# Patient Record
Sex: Male | Born: 1970 | Race: Black or African American | Hispanic: No | Marital: Single | Smoking: Current every day smoker
Health system: Southern US, Community
[De-identification: ages and names within clinical notes are randomized; demographics above are authoritative.]

## PROBLEM LIST (undated history)

## (undated) DIAGNOSIS — W3400XA Accidental discharge from unspecified firearms or gun, initial encounter: Secondary | ICD-10-CM

## (undated) SURGERY — EXPLORATION, CHEST
Anesthesia: General

---

## 1998-01-07 ENCOUNTER — Emergency Department (HOSPITAL_COMMUNITY): Admission: EM | Admit: 1998-01-07 | Discharge: 1998-01-07 | Payer: Self-pay | Admitting: Emergency Medicine

## 1998-07-27 ENCOUNTER — Emergency Department (HOSPITAL_COMMUNITY): Admission: EM | Admit: 1998-07-27 | Discharge: 1998-07-27 | Payer: Self-pay | Admitting: Emergency Medicine

## 1998-09-02 ENCOUNTER — Emergency Department (HOSPITAL_COMMUNITY): Admission: EM | Admit: 1998-09-02 | Discharge: 1998-09-02 | Payer: Self-pay | Admitting: Emergency Medicine

## 1998-12-11 ENCOUNTER — Emergency Department (HOSPITAL_COMMUNITY): Admission: EM | Admit: 1998-12-11 | Discharge: 1998-12-11 | Payer: Self-pay | Admitting: Emergency Medicine

## 1999-04-07 ENCOUNTER — Emergency Department (HOSPITAL_COMMUNITY): Admission: EM | Admit: 1999-04-07 | Discharge: 1999-04-07 | Payer: Self-pay | Admitting: Emergency Medicine

## 2000-12-26 ENCOUNTER — Encounter: Payer: Self-pay | Admitting: Emergency Medicine

## 2000-12-26 ENCOUNTER — Emergency Department (HOSPITAL_COMMUNITY): Admission: EM | Admit: 2000-12-26 | Discharge: 2000-12-26 | Payer: Self-pay

## 2001-12-16 ENCOUNTER — Emergency Department (HOSPITAL_COMMUNITY): Admission: EM | Admit: 2001-12-16 | Discharge: 2001-12-16 | Payer: Self-pay | Admitting: Emergency Medicine

## 2003-02-10 ENCOUNTER — Emergency Department (HOSPITAL_COMMUNITY): Admission: AD | Admit: 2003-02-10 | Discharge: 2003-02-10 | Payer: Self-pay | Admitting: Family Medicine

## 2003-03-11 ENCOUNTER — Emergency Department (HOSPITAL_COMMUNITY): Admission: EM | Admit: 2003-03-11 | Discharge: 2003-03-11 | Payer: Self-pay | Admitting: Emergency Medicine

## 2003-03-12 ENCOUNTER — Emergency Department (HOSPITAL_COMMUNITY): Admission: EM | Admit: 2003-03-12 | Discharge: 2003-03-12 | Payer: Self-pay | Admitting: Emergency Medicine

## 2003-12-29 ENCOUNTER — Emergency Department (HOSPITAL_COMMUNITY): Admission: EM | Admit: 2003-12-29 | Discharge: 2003-12-29 | Payer: Self-pay | Admitting: Emergency Medicine

## 2010-04-28 ENCOUNTER — Emergency Department (HOSPITAL_COMMUNITY)
Admission: EM | Admit: 2010-04-28 | Discharge: 2010-04-29 | Disposition: A | Discharge status: Home / Self Care | Payer: Self-pay | Attending: Emergency Medicine | Admitting: Emergency Medicine

## 2010-04-28 DIAGNOSIS — R21 Rash and other nonspecific skin eruption: Secondary | ICD-10-CM | POA: Insufficient documentation

## 2010-04-28 DIAGNOSIS — IMO0002 Reserved for concepts with insufficient information to code with codable children: Secondary | ICD-10-CM | POA: Insufficient documentation

## 2010-04-28 DIAGNOSIS — IMO0001 Reserved for inherently not codable concepts without codable children: Secondary | ICD-10-CM | POA: Insufficient documentation

## 2010-04-28 DIAGNOSIS — L2989 Other pruritus: Secondary | ICD-10-CM | POA: Insufficient documentation

## 2010-04-28 DIAGNOSIS — L298 Other pruritus: Secondary | ICD-10-CM | POA: Insufficient documentation

## 2010-08-30 ENCOUNTER — Emergency Department (HOSPITAL_COMMUNITY)
Admission: EM | Admit: 2010-08-30 | Discharge: 2010-08-30 | Disposition: A | Discharge status: Home / Self Care | Payer: Self-pay | Attending: Emergency Medicine | Admitting: Emergency Medicine

## 2010-08-30 DIAGNOSIS — R319 Hematuria, unspecified: Secondary | ICD-10-CM | POA: Insufficient documentation

## 2010-08-30 LAB — URINALYSIS, ROUTINE W REFLEX MICROSCOPIC
Hgb urine dipstick: NEGATIVE
Ketones, ur: 15 mg/dL — AB
Nitrite: NEGATIVE
Specific Gravity, Urine: 1.031 — ABNORMAL HIGH (ref 1.005–1.030)
pH: 6 (ref 5.0–8.0)

## 2010-08-31 LAB — GC/CHLAMYDIA PROBE AMP, URINE: Chlamydia, Swab/Urine, PCR: NEGATIVE

## 2013-08-13 ENCOUNTER — Emergency Department (HOSPITAL_COMMUNITY): Payer: No Typology Code available for payment source

## 2013-08-13 ENCOUNTER — Inpatient Hospital Stay (HOSPITAL_COMMUNITY): Payer: No Typology Code available for payment source

## 2013-08-13 ENCOUNTER — Encounter (HOSPITAL_COMMUNITY): Payer: No Typology Code available for payment source | Admitting: Anesthesiology

## 2013-08-13 ENCOUNTER — Emergency Department (HOSPITAL_COMMUNITY): Payer: No Typology Code available for payment source | Admitting: Anesthesiology

## 2013-08-13 ENCOUNTER — Encounter (HOSPITAL_COMMUNITY): Payer: Self-pay | Admitting: Emergency Medicine

## 2013-08-13 ENCOUNTER — Other Ambulatory Visit: Payer: Self-pay

## 2013-08-13 ENCOUNTER — Inpatient Hospital Stay (HOSPITAL_COMMUNITY)
Admission: EM | Admit: 2013-08-13 | Discharge: 2013-08-18 | DRG: 237 | Disposition: A | Discharge status: Home / Self Care | Payer: No Typology Code available for payment source | Attending: General Surgery | Admitting: General Surgery

## 2013-08-13 ENCOUNTER — Encounter (HOSPITAL_COMMUNITY): Admission: EM | Disposition: A | Discharge status: Home / Self Care | Payer: Self-pay | Source: Home / Self Care

## 2013-08-13 DIAGNOSIS — R4182 Altered mental status, unspecified: Secondary | ICD-10-CM | POA: Diagnosis present

## 2013-08-13 DIAGNOSIS — S2190XA Unspecified open wound of unspecified part of thorax, initial encounter: Principal | ICD-10-CM | POA: Diagnosis present

## 2013-08-13 DIAGNOSIS — J9600 Acute respiratory failure, unspecified whether with hypoxia or hypercapnia: Secondary | ICD-10-CM | POA: Diagnosis present

## 2013-08-13 DIAGNOSIS — D62 Acute posthemorrhagic anemia: Secondary | ICD-10-CM | POA: Diagnosis not present

## 2013-08-13 DIAGNOSIS — R579 Shock, unspecified: Secondary | ICD-10-CM | POA: Diagnosis present

## 2013-08-13 DIAGNOSIS — R791 Abnormal coagulation profile: Secondary | ICD-10-CM | POA: Diagnosis present

## 2013-08-13 DIAGNOSIS — S2690XA Unspecified injury of heart, unspecified with or without hemopericardium, initial encounter: Secondary | ICD-10-CM

## 2013-08-13 DIAGNOSIS — E872 Acidosis, unspecified: Secondary | ICD-10-CM | POA: Diagnosis present

## 2013-08-13 DIAGNOSIS — S21139A Puncture wound without foreign body of unspecified front wall of thorax without penetration into thoracic cavity, initial encounter: Secondary | ICD-10-CM

## 2013-08-13 DIAGNOSIS — W3400XA Accidental discharge from unspecified firearms or gun, initial encounter: Secondary | ICD-10-CM | POA: Diagnosis not present

## 2013-08-13 DIAGNOSIS — I314 Cardiac tamponade: Secondary | ICD-10-CM | POA: Diagnosis present

## 2013-08-13 DIAGNOSIS — T794XXA Traumatic shock, initial encounter: Secondary | ICD-10-CM

## 2013-08-13 DIAGNOSIS — S26022A Major laceration of heart with hemopericardium, initial encounter: Principal | ICD-10-CM

## 2013-08-13 HISTORY — PX: INTRAOPERATIVE TRANSESOPHAGEAL ECHOCARDIOGRAM: SHX5062

## 2013-08-13 HISTORY — PX: MEDIASTERNOTOMY: SHX5084

## 2013-08-13 LAB — POCT I-STAT 4, (NA,K, GLUC, HGB,HCT)
Glucose, Bld: 185 mg/dL — ABNORMAL HIGH (ref 70–99)
Glucose, Bld: 250 mg/dL — ABNORMAL HIGH (ref 70–99)
Glucose, Bld: 157 mg/dL — ABNORMAL HIGH (ref 70–99)
Glucose, Bld: 195 mg/dL — ABNORMAL HIGH (ref 70–99)
Glucose, Bld: 35 mg/dL — CL (ref 70–99)
HCT: 39 % (ref 39.0–52.0)
HCT: 40 % (ref 39.0–52.0)
HCT: 26 % — ABNORMAL LOW (ref 39.0–52.0)
HCT: 29 % — ABNORMAL LOW (ref 39.0–52.0)
HCT: 43 % (ref 39.0–52.0)
HEMOGLOBIN: 14.6 g/dL (ref 13.0–17.0)
Hemoglobin: 13.3 g/dL (ref 13.0–17.0)
Hemoglobin: 13.6 g/dL (ref 13.0–17.0)
Hemoglobin: 8.8 g/dL — ABNORMAL LOW (ref 13.0–17.0)
Hemoglobin: 9.9 g/dL — ABNORMAL LOW (ref 13.0–17.0)
POTASSIUM: 3.1 meq/L — AB (ref 3.7–5.3)
Potassium: 3.4 mEq/L — ABNORMAL LOW (ref 3.7–5.3)
Potassium: 5.5 mEq/L — ABNORMAL HIGH (ref 3.7–5.3)
Potassium: 3.3 mEq/L — ABNORMAL LOW (ref 3.7–5.3)
Potassium: 3.8 mEq/L (ref 3.7–5.3)
Sodium: 139 mEq/L (ref 137–147)
Sodium: 141 mEq/L (ref 137–147)
Sodium: 142 mEq/L (ref 137–147)
Sodium: 143 mEq/L (ref 137–147)
Sodium: 146 mEq/L (ref 137–147)

## 2013-08-13 LAB — I-STAT ARTERIAL BLOOD GAS, ED
Acid-base deficit: 21 mmol/L — ABNORMAL HIGH (ref 0.0–2.0)
Acid-base deficit: 9 mmol/L — ABNORMAL HIGH (ref 0.0–2.0)
Acid-base deficit: 3 mmol/L — ABNORMAL HIGH (ref 0.0–2.0)
Acid-base deficit: 4 mmol/L — ABNORMAL HIGH (ref 0.0–2.0)
Bicarbonate: 17.8 mEq/L — ABNORMAL LOW (ref 20.0–24.0)
Bicarbonate: 5.4 mEq/L — ABNORMAL LOW (ref 20.0–24.0)
Bicarbonate: 22 mEq/L (ref 20.0–24.0)
Bicarbonate: 23 mEq/L (ref 20.0–24.0)
O2 Saturation: 100 %
O2 Saturation: 100 %
O2 Saturation: 100 %
O2 Saturation: 91 %
TCO2: 19 mmol/L (ref 0–100)
TCO2: 6 mmol/L (ref 0–100)
TCO2: 23 mmol/L (ref 0–100)
TCO2: 24 mmol/L (ref 0–100)
pCO2 arterial: 14.9 mmHg — CL (ref 35.0–45.0)
pCO2 arterial: 42.4 mmHg (ref 35.0–45.0)
pCO2 arterial: 41.1 mmHg (ref 35.0–45.0)
pCO2 arterial: 45.5 mmHg — ABNORMAL HIGH (ref 35.0–45.0)
pH, Arterial: 7.169 — CL (ref 7.350–7.450)
pH, Arterial: 7.231 — ABNORMAL LOW (ref 7.350–7.450)
pH, Arterial: 7.312 — ABNORMAL LOW (ref 7.350–7.450)
pH, Arterial: 7.338 — ABNORMAL LOW (ref 7.350–7.450)
pO2, Arterial: 246 mmHg — ABNORMAL HIGH (ref 80.0–100.0)
pO2, Arterial: 294 mmHg — ABNORMAL HIGH (ref 80.0–100.0)
pO2, Arterial: 362 mmHg — ABNORMAL HIGH (ref 80.0–100.0)
pO2, Arterial: 67 mmHg — ABNORMAL LOW (ref 80.0–100.0)

## 2013-08-13 LAB — CBC
HCT: 38.5 % — ABNORMAL LOW (ref 39.0–52.0)
HCT: 34.9 % — ABNORMAL LOW (ref 39.0–52.0)
Hemoglobin: 13.5 g/dL (ref 13.0–17.0)
Hemoglobin: 12.1 g/dL — ABNORMAL LOW (ref 13.0–17.0)
MCH: 30.1 pg (ref 26.0–34.0)
MCH: 29.7 pg (ref 26.0–34.0)
MCHC: 35.1 g/dL (ref 30.0–36.0)
MCHC: 34.7 g/dL (ref 30.0–36.0)
MCV: 85.9 fL (ref 78.0–100.0)
MCV: 85.7 fL (ref 78.0–100.0)
Platelets: 120 10*3/uL — ABNORMAL LOW (ref 150–400)
Platelets: 150 10*3/uL (ref 150–400)
RBC: 4.48 MIL/uL (ref 4.22–5.81)
RBC: 4.07 MIL/uL — ABNORMAL LOW (ref 4.22–5.81)
RDW: 13.4 % (ref 11.5–15.5)
RDW: 13.6 % (ref 11.5–15.5)
WBC: 10.8 10*3/uL — ABNORMAL HIGH (ref 4.0–10.5)
WBC: 6.9 10*3/uL (ref 4.0–10.5)

## 2013-08-13 LAB — POCT I-STAT 3, ART BLOOD GAS (G3+)
BICARBONATE: 24 meq/L (ref 20.0–24.0)
O2 Saturation: 96 %
PH ART: 7.44 (ref 7.350–7.450)
TCO2: 25 mmol/L (ref 0–100)
pCO2 arterial: 34.9 mmHg — ABNORMAL LOW (ref 35.0–45.0)
pO2, Arterial: 72 mmHg — ABNORMAL LOW (ref 80.0–100.0)

## 2013-08-13 LAB — POCT I-STAT, CHEM 8
BUN: 9 mg/dL (ref 6–23)
Calcium, Ion: 1.05 mmol/L — ABNORMAL LOW (ref 1.12–1.23)
Chloride: 108 mEq/L (ref 96–112)
Creatinine, Ser: 0.9 mg/dL (ref 0.50–1.35)
Glucose, Bld: 105 mg/dL — ABNORMAL HIGH (ref 70–99)
HCT: 32 % — ABNORMAL LOW (ref 39.0–52.0)
HEMOGLOBIN: 10.9 g/dL — AB (ref 13.0–17.0)
Potassium: 3.4 mEq/L — ABNORMAL LOW (ref 3.7–5.3)
SODIUM: 143 meq/L (ref 137–147)
TCO2: 23 mmol/L (ref 0–100)

## 2013-08-13 LAB — HEMOGLOBIN AND HEMATOCRIT, BLOOD
HCT: 23.2 % — ABNORMAL LOW (ref 39.0–52.0)
Hemoglobin: 8.1 g/dL — ABNORMAL LOW (ref 13.0–17.0)

## 2013-08-13 LAB — GLUCOSE, CAPILLARY
GLUCOSE-CAPILLARY: 119 mg/dL — AB (ref 70–99)
Glucose-Capillary: 73 mg/dL (ref 70–99)
Glucose-Capillary: 73 mg/dL (ref 70–99)
Glucose-Capillary: 78 mg/dL (ref 70–99)
Glucose-Capillary: 81 mg/dL (ref 70–99)
Glucose-Capillary: 107 mg/dL — ABNORMAL HIGH (ref 70–99)
Glucose-Capillary: 106 mg/dL — ABNORMAL HIGH (ref 70–99)
Glucose-Capillary: 138 mg/dL — ABNORMAL HIGH (ref 70–99)

## 2013-08-13 LAB — I-STAT CHEM 8, ED
BUN: 14 mg/dL (ref 6–23)
CALCIUM ION: 1.2 mmol/L (ref 1.12–1.23)
CHLORIDE: 103 meq/L (ref 96–112)
Creatinine, Ser: 1.5 mg/dL — ABNORMAL HIGH (ref 0.50–1.35)
Glucose, Bld: 211 mg/dL — ABNORMAL HIGH (ref 70–99)
HEMATOCRIT: 45 % (ref 39.0–52.0)
Hemoglobin: 15.3 g/dL (ref 13.0–17.0)
Potassium: 3.3 mEq/L — ABNORMAL LOW (ref 3.7–5.3)
SODIUM: 141 meq/L (ref 137–147)
TCO2: 19 mmol/L (ref 0–100)

## 2013-08-13 LAB — ABO/RH: ABO/RH(D): B POS

## 2013-08-13 LAB — MAGNESIUM: Magnesium: 2.9 mg/dL — ABNORMAL HIGH (ref 1.5–2.5)

## 2013-08-13 LAB — CREATININE, SERUM
Creatinine, Ser: 1.06 mg/dL (ref 0.50–1.35)
GFR calc Af Amer: >90 mL/min (ref >90)
GFR calc non Af Amer: 84 mL/min — ABNORMAL LOW (ref >90)

## 2013-08-13 LAB — PREPARE RBC (CROSSMATCH)

## 2013-08-13 LAB — PROTIME-INR
INR: 1.34 (ref 0.00–1.49)
Prothrombin Time: 16.3 seconds — ABNORMAL HIGH (ref 11.6–15.2)

## 2013-08-13 LAB — PLATELET COUNT: Platelets: 97 10*3/uL — ABNORMAL LOW (ref 150–400)

## 2013-08-13 LAB — APTT: APTT: 31 s (ref 24–37)

## 2013-08-13 SURGERY — MEDIAN STERNOTOMY
Anesthesia: General | Site: Chest

## 2013-08-13 MED ORDER — PROTAMINE SULFATE 10 MG/ML IV SOLN
INTRAVENOUS | Status: DC | PRN
  Administered 2013-08-13: 40 mg via INTRAVENOUS
  Administered 2013-08-13 (×4): 50 mg via INTRAVENOUS

## 2013-08-13 MED ORDER — ASPIRIN EC 325 MG PO TBEC
325.0000 mg | DELAYED_RELEASE_TABLET | Freq: Every day | ORAL | Status: DC
  Administered 2013-08-14 – 2013-08-18 (×5): 325 mg via ORAL
  Filled 2013-08-13 (×5): qty 1

## 2013-08-13 MED ORDER — KETAMINE HCL 100 MG/ML IJ SOLN
INTRAMUSCULAR | Status: AC
  Filled 2013-08-13: qty 1

## 2013-08-13 MED ORDER — NITROGLYCERIN IN D5W 200-5 MCG/ML-% IV SOLN
2.0000 ug/min | INTRAVENOUS | Status: DC
Start: 2013-08-13 — End: 2013-08-13

## 2013-08-13 MED ORDER — VANCOMYCIN HCL IN DEXTROSE 1-5 GM/200ML-% IV SOLN
1000.0000 mg | Freq: Once | INTRAVENOUS | Status: AC
  Administered 2013-08-13: 1000 mg via INTRAVENOUS
  Filled 2013-08-13: qty 200

## 2013-08-13 MED ORDER — METOPROLOL TARTRATE 25 MG/10 ML ORAL SUSPENSION
12.5000 mg | Freq: Two times a day (BID) | ORAL | Status: DC
  Filled 2013-08-13 (×11): qty 5

## 2013-08-13 MED ORDER — 0.9 % SODIUM CHLORIDE (POUR BTL) OPTIME
TOPICAL | Status: DC | PRN
  Administered 2013-08-13: 5000 mL

## 2013-08-13 MED ORDER — INSULIN REGULAR HUMAN 100 UNIT/ML IJ SOLN
INTRAMUSCULAR | Status: AC
  Administered 2013-08-13: 1 [IU]/h via INTRAVENOUS
  Administered 2013-08-13: 5.7 [IU]/h via INTRAVENOUS
  Filled 2013-08-13: qty 1

## 2013-08-13 MED ORDER — DEXTROSE 5 % IV SOLN
0.5000 ug/min | INTRAVENOUS | Status: DC
  Filled 2013-08-13: qty 4

## 2013-08-13 MED ORDER — BUDESONIDE-FORMOTEROL FUMARATE 160-4.5 MCG/ACT IN AERO
2.0000 | INHALATION_SPRAY | Freq: Two times a day (BID) | RESPIRATORY_TRACT | Status: DC
  Administered 2013-08-14 – 2013-08-17 (×7): 2 via RESPIRATORY_TRACT
  Filled 2013-08-13: qty 6

## 2013-08-13 MED ORDER — PROPOFOL 10 MG/ML IV EMUL
INTRAVENOUS | Status: AC
  Filled 2013-08-13: qty 100

## 2013-08-13 MED ORDER — HEPARIN SODIUM (PORCINE) 1000 UNIT/ML IJ SOLN
INTRAMUSCULAR | Status: AC
  Filled 2013-08-13: qty 1

## 2013-08-13 MED ORDER — LACTATED RINGERS IV SOLN
INTRAVENOUS | Status: DC | PRN
  Administered 2013-08-13 (×2): via INTRAVENOUS

## 2013-08-13 MED ORDER — ROCURONIUM BROMIDE 100 MG/10ML IV SOLN
INTRAVENOUS | Status: DC | PRN
  Administered 2013-08-13: 40 mg via INTRAVENOUS
  Administered 2013-08-13: 30 mg via INTRAVENOUS

## 2013-08-13 MED ORDER — PROTAMINE SULFATE 10 MG/ML IV SOLN
INTRAVENOUS | Status: AC
  Filled 2013-08-13: qty 25

## 2013-08-13 MED ORDER — ACETAMINOPHEN 650 MG RE SUPP: 650.0000 mg | Freq: Once | RECTAL | Status: DC

## 2013-08-13 MED ORDER — DEXTROSE 5 % IV SOLN: 1.5000 g | INTRAVENOUS | Status: DC

## 2013-08-13 MED ORDER — LACTATED RINGERS IV SOLN
INTRAVENOUS | Status: DC | PRN
  Administered 2013-08-13: 09:00:00 via INTRAVENOUS

## 2013-08-13 MED ORDER — MORPHINE SULFATE 2 MG/ML IJ SOLN
2.0000 mg | INTRAMUSCULAR | Status: DC | PRN
  Administered 2013-08-14: 2 mg via INTRAVENOUS
  Administered 2013-08-14: 4 mg via INTRAVENOUS
  Administered 2013-08-14: 2 mg via INTRAVENOUS
  Administered 2013-08-15 – 2013-08-16 (×6): 4 mg via INTRAVENOUS
  Administered 2013-08-16: 2 mg via INTRAVENOUS
  Administered 2013-08-16: 4 mg via INTRAVENOUS
  Filled 2013-08-13 (×2): qty 2
  Filled 2013-08-13: qty 1
  Filled 2013-08-13 (×3): qty 2
  Filled 2013-08-13: qty 1
  Filled 2013-08-13: qty 2
  Filled 2013-08-13 (×2): qty 1
  Filled 2013-08-13 (×2): qty 2

## 2013-08-13 MED ORDER — SODIUM CHLORIDE 0.45 % IV SOLN: INTRAVENOUS | Status: DC

## 2013-08-13 MED ORDER — SODIUM CHLORIDE 0.9 % IV SOLN
INTRAVENOUS | Status: AC
  Administered 2013-08-13: 70 mL/h via INTRAVENOUS
  Filled 2013-08-13: qty 40

## 2013-08-13 MED ORDER — SODIUM CHLORIDE 0.9 % IJ SOLN: 3.0000 mL | INTRAMUSCULAR | Status: DC | PRN

## 2013-08-13 MED ORDER — DOCUSATE SODIUM 100 MG PO CAPS
200.0000 mg | ORAL_CAPSULE | Freq: Every day | ORAL | Status: DC
  Administered 2013-08-14 – 2013-08-17 (×4): 200 mg via ORAL
  Filled 2013-08-13 (×5): qty 2

## 2013-08-13 MED ORDER — ACETAMINOPHEN 160 MG/5ML PO SOLN
1000.0000 mg | Freq: Four times a day (QID) | ORAL | Status: DC
  Administered 2013-08-13 – 2013-08-14 (×2): 1000 mg
  Filled 2013-08-13 (×2): qty 40.6

## 2013-08-13 MED ORDER — PLASMA-LYTE 148 IV SOLN
INTRAVENOUS | Status: AC
  Administered 2013-08-13: 10:00:00
  Filled 2013-08-13: qty 2.5

## 2013-08-13 MED ORDER — MIDAZOLAM HCL 2 MG/2ML IJ SOLN
INTRAMUSCULAR | Status: AC
  Filled 2013-08-13: qty 2

## 2013-08-13 MED ORDER — SODIUM CHLORIDE 0.9 % IV SOLN
INTRAVENOUS | Status: DC
  Filled 2013-08-13: qty 1

## 2013-08-13 MED ORDER — PROPOFOL 10 MG/ML IV BOLUS
INTRAVENOUS | Status: AC
  Filled 2013-08-13: qty 20

## 2013-08-13 MED ORDER — DEXTROSE 5 % IV SOLN
750.0000 mg | INTRAVENOUS | Status: DC
  Filled 2013-08-13: qty 750

## 2013-08-13 MED ORDER — SODIUM CHLORIDE 0.9 % IV SOLN
INTRAVENOUS | Status: DC
  Filled 2013-08-13: qty 30

## 2013-08-13 MED ORDER — ARTIFICIAL TEARS OP OINT
TOPICAL_OINTMENT | OPHTHALMIC | Status: DC | PRN
  Administered 2013-08-13: 1 via OPHTHALMIC

## 2013-08-13 MED ORDER — SODIUM CHLORIDE 0.9 % IV SOLN
20.0000 ug | Freq: Once | INTRAVENOUS | Status: AC
  Administered 2013-08-13: 20 ug via INTRAVENOUS
  Filled 2013-08-13: qty 5

## 2013-08-13 MED ORDER — SUCCINYLCHOLINE CHLORIDE 20 MG/ML IJ SOLN
100.0000 mg | Freq: Once | INTRAMUSCULAR | Status: AC
  Administered 2013-08-13: 100 mg via INTRAVENOUS

## 2013-08-13 MED ORDER — FENTANYL CITRATE 0.05 MG/ML IJ SOLN
INTRAMUSCULAR | Status: AC
  Filled 2013-08-13: qty 5

## 2013-08-13 MED ORDER — ACETAMINOPHEN 500 MG PO TABS
1000.0000 mg | ORAL_TABLET | Freq: Four times a day (QID) | ORAL | Status: DC
  Administered 2013-08-14 – 2013-08-17 (×10): 1000 mg via ORAL
  Filled 2013-08-13 (×18): qty 2

## 2013-08-13 MED ORDER — DEXMEDETOMIDINE HCL IN NACL 400 MCG/100ML IV SOLN
0.1000 ug/kg/h | INTRAVENOUS | Status: AC
  Administered 2013-08-13: 0.3 ug/kg/h via INTRAVENOUS
  Filled 2013-08-13: qty 100

## 2013-08-13 MED ORDER — FENTANYL CITRATE 0.05 MG/ML IJ SOLN
INTRAMUSCULAR | Status: DC | PRN
  Administered 2013-08-13 (×3): 250 ug via INTRAVENOUS

## 2013-08-13 MED ORDER — METOPROLOL TARTRATE 1 MG/ML IV SOLN
2.5000 mg | INTRAVENOUS | Status: DC | PRN
Start: 2013-08-13 — End: 2013-08-18

## 2013-08-13 MED ORDER — MIDAZOLAM HCL 5 MG/5ML IJ SOLN
INTRAMUSCULAR | Status: DC | PRN
  Administered 2013-08-13 (×2): 4 mg via INTRAVENOUS
  Administered 2013-08-13 (×4): 2 mg via INTRAVENOUS

## 2013-08-13 MED ORDER — PROPOFOL 10 MG/ML IV EMUL
5.0000 ug/kg/min | Freq: Once | INTRAVENOUS | Status: DC
Start: 2013-08-13 — End: 2013-08-13
  Administered 2013-08-13: 5 ug/kg/min via INTRAVENOUS

## 2013-08-13 MED ORDER — DEXMEDETOMIDINE HCL IN NACL 200 MCG/50ML IV SOLN
0.4000 ug/kg/h | INTRAVENOUS | Status: DC
  Administered 2013-08-13: 0.8 ug/kg/h via INTRAVENOUS
  Administered 2013-08-13 – 2013-08-14 (×3): 0.9 ug/kg/h via INTRAVENOUS
  Filled 2013-08-13 (×5): qty 50

## 2013-08-13 MED ORDER — FAMOTIDINE IN NACL 20-0.9 MG/50ML-% IV SOLN
20.0000 mg | Freq: Two times a day (BID) | INTRAVENOUS | Status: AC
  Administered 2013-08-13: 20 mg via INTRAVENOUS
  Filled 2013-08-13: qty 50

## 2013-08-13 MED ORDER — SODIUM BICARBONATE 8.4 % IV SOLN
INTRAVENOUS | Status: DC | PRN
  Administered 2013-08-13 (×2): 50 mL via INTRAVENOUS

## 2013-08-13 MED ORDER — SODIUM CHLORIDE 0.9 % IV SOLN
INTRAVENOUS | Status: DC
Start: 2013-08-13 — End: 2013-08-18

## 2013-08-13 MED ORDER — SODIUM CHLORIDE 0.9 % IV SOLN: 1250.0000 mg | INTRAVENOUS | Status: DC

## 2013-08-13 MED ORDER — SODIUM CHLORIDE 0.9 % IV SOLN: 250.0000 mL | INTRAVENOUS | Status: DC

## 2013-08-13 MED ORDER — POTASSIUM CHLORIDE 10 MEQ/50ML IV SOLN
10.0000 meq | Freq: Once | INTRAVENOUS | Status: AC
  Administered 2013-08-13: 10 meq via INTRAVENOUS

## 2013-08-13 MED ORDER — METOPROLOL TARTRATE 12.5 MG HALF TABLET
12.5000 mg | ORAL_TABLET | Freq: Two times a day (BID) | ORAL | Status: DC
  Administered 2013-08-14 – 2013-08-18 (×9): 12.5 mg via ORAL
  Filled 2013-08-13 (×12): qty 1

## 2013-08-13 MED ORDER — DEXTROSE 50 % IV SOLN
INTRAVENOUS | Status: AC
  Administered 2013-08-13: 50 mL
  Filled 2013-08-13: qty 50

## 2013-08-13 MED ORDER — DEXTROSE 5 % IV SOLN
1.5000 g | INTRAVENOUS | Status: AC
  Administered 2013-08-13: 1.5 g via INTRAVENOUS
  Administered 2013-08-13: .75 g via INTRAVENOUS
  Filled 2013-08-13: qty 1.5

## 2013-08-13 MED ORDER — IPRATROPIUM-ALBUTEROL 0.5-2.5 (3) MG/3ML IN SOLN
3.0000 mL | Freq: Four times a day (QID) | RESPIRATORY_TRACT | Status: DC
  Administered 2013-08-13 – 2013-08-14 (×5): 3 mL via RESPIRATORY_TRACT
  Filled 2013-08-13 (×5): qty 3

## 2013-08-13 MED ORDER — NITROGLYCERIN IN D5W 200-5 MCG/ML-% IV SOLN: 0.0000 ug/min | INTRAVENOUS | Status: DC

## 2013-08-13 MED ORDER — NALOXONE HCL 0.4 MG/ML IJ SOLN
INTRAMUSCULAR | Status: AC
  Filled 2013-08-13: qty 1

## 2013-08-13 MED ORDER — SODIUM CHLORIDE 0.9 % IJ SOLN
3.0000 mL | Freq: Two times a day (BID) | INTRAMUSCULAR | Status: DC
  Administered 2013-08-14 – 2013-08-18 (×8): 3 mL via INTRAVENOUS

## 2013-08-13 MED ORDER — ACETAMINOPHEN 160 MG/5ML PO SOLN: 650.0000 mg | Freq: Once | ORAL | Status: DC

## 2013-08-13 MED ORDER — DOPAMINE-DEXTROSE 3.2-5 MG/ML-% IV SOLN: 0.0000 ug/kg/min | INTRAVENOUS | Status: DC

## 2013-08-13 MED ORDER — VANCOMYCIN HCL IN DEXTROSE 1-5 GM/200ML-% IV SOLN
1000.0000 mg | INTRAVENOUS | Status: AC
  Administered 2013-08-13: 1000 mg via INTRAVENOUS
  Filled 2013-08-13: qty 200

## 2013-08-13 MED ORDER — LEVALBUTEROL HCL 1.25 MG/0.5ML IN NEBU
1.2500 mg | INHALATION_SOLUTION | Freq: Four times a day (QID) | RESPIRATORY_TRACT | Status: DC
  Administered 2013-08-14 – 2013-08-16 (×7): 1.25 mg via RESPIRATORY_TRACT
  Filled 2013-08-13 (×11): qty 0.5

## 2013-08-13 MED ORDER — MAGNESIUM SULFATE 4000MG/100ML IJ SOLN
4.0000 g | Freq: Once | INTRAMUSCULAR | Status: AC
  Administered 2013-08-13: 4 g via INTRAVENOUS
  Filled 2013-08-13: qty 100

## 2013-08-13 MED ORDER — ALBUMIN HUMAN 5 % IV SOLN: 250.0000 mL | INTRAVENOUS | Status: AC | PRN

## 2013-08-13 MED ORDER — ASPIRIN 81 MG PO CHEW: 324.0000 mg | CHEWABLE_TABLET | Freq: Every day | ORAL | Status: DC

## 2013-08-13 MED ORDER — ETOMIDATE 2 MG/ML IV SOLN
INTRAVENOUS | Status: AC
  Filled 2013-08-13: qty 20

## 2013-08-13 MED ORDER — POTASSIUM CHLORIDE 2 MEQ/ML IV SOLN
80.0000 meq | INTRAVENOUS | Status: DC
Start: 2013-08-13 — End: 2013-08-13
  Filled 2013-08-13: qty 40

## 2013-08-13 MED ORDER — SODIUM CHLORIDE 0.9 % IV SOLN
INTRAVENOUS | Status: DC | PRN
  Administered 2013-08-13 (×2): via INTRAVENOUS

## 2013-08-13 MED ORDER — POTASSIUM CHLORIDE 10 MEQ/50ML IV SOLN
10.0000 meq | INTRAVENOUS | Status: AC
  Administered 2013-08-13 (×3): 10 meq via INTRAVENOUS
  Filled 2013-08-13: qty 50

## 2013-08-13 MED ORDER — ROCURONIUM BROMIDE 50 MG/5ML IV SOLN
INTRAVENOUS | Status: AC
  Filled 2013-08-13: qty 2

## 2013-08-13 MED ORDER — ARTIFICIAL TEARS OP OINT
TOPICAL_OINTMENT | OPHTHALMIC | Status: AC
  Filled 2013-08-13: qty 3.5

## 2013-08-13 MED ORDER — LEVALBUTEROL HCL 1.25 MG/0.5ML IN NEBU
1.2500 mg | INHALATION_SOLUTION | Freq: Four times a day (QID) | RESPIRATORY_TRACT | Status: DC
  Filled 2013-08-13 (×3): qty 0.5

## 2013-08-13 MED ORDER — LACTATED RINGERS IV SOLN: 500.0000 mL | Freq: Once | INTRAVENOUS | Status: AC | PRN

## 2013-08-13 MED ORDER — MAGNESIUM SULFATE 50 % IJ SOLN
40.0000 meq | INTRAMUSCULAR | Status: DC
  Filled 2013-08-13: qty 10

## 2013-08-13 MED ORDER — MIDAZOLAM HCL 10 MG/2ML IJ SOLN
INTRAMUSCULAR | Status: AC
  Filled 2013-08-13: qty 2

## 2013-08-13 MED ORDER — PHENYLEPHRINE HCL 10 MG/ML IJ SOLN
30.0000 ug/min | INTRAVENOUS | Status: AC
  Administered 2013-08-13: 20 ug/min via INTRAVENOUS
  Filled 2013-08-13: qty 2

## 2013-08-13 MED ORDER — LACTATED RINGERS IV SOLN
INTRAVENOUS | Status: DC
  Administered 2013-08-13: 13:00:00 via INTRAVENOUS

## 2013-08-13 MED ORDER — MORPHINE SULFATE 2 MG/ML IJ SOLN
1.0000 mg | INTRAMUSCULAR | Status: AC | PRN
  Administered 2013-08-13 (×2): 2 mg via INTRAVENOUS
  Filled 2013-08-13: qty 1

## 2013-08-13 MED ORDER — INSULIN REGULAR BOLUS VIA INFUSION
0.0000 [IU] | Freq: Three times a day (TID) | INTRAVENOUS | Status: DC
  Filled 2013-08-13: qty 10

## 2013-08-13 MED ORDER — PHENYLEPHRINE HCL 10 MG/ML IJ SOLN
0.0000 ug/min | INTRAMUSCULAR | Status: DC
  Filled 2013-08-13: qty 2

## 2013-08-13 MED ORDER — LIDOCAINE HCL (CARDIAC) 20 MG/ML IV SOLN
INTRAVENOUS | Status: AC
  Filled 2013-08-13: qty 5

## 2013-08-13 MED ORDER — DOPAMINE-DEXTROSE 3.2-5 MG/ML-% IV SOLN: 2.0000 ug/kg/min | INTRAVENOUS | Status: DC

## 2013-08-13 MED ORDER — HEPARIN SODIUM (PORCINE) 1000 UNIT/ML IJ SOLN
INTRAMUSCULAR | Status: DC | PRN
  Administered 2013-08-13: 24000 [IU] via INTRAVENOUS

## 2013-08-13 MED ORDER — ALBUMIN HUMAN 5 % IV SOLN
INTRAVENOUS | Status: DC | PRN
  Administered 2013-08-13: 10:00:00 via INTRAVENOUS

## 2013-08-13 MED ORDER — DEXTROSE 5 % IV SOLN
1.5000 g | Freq: Two times a day (BID) | INTRAVENOUS | Status: AC
  Administered 2013-08-13 – 2013-08-15 (×4): 1.5 g via INTRAVENOUS
  Filled 2013-08-13 (×4): qty 1.5

## 2013-08-13 MED ORDER — POTASSIUM CHLORIDE 10 MEQ/50ML IV SOLN
10.0000 meq | INTRAVENOUS | Status: AC
  Administered 2013-08-13 (×3): 10 meq via INTRAVENOUS

## 2013-08-13 MED ORDER — OXYCODONE HCL 5 MG PO TABS
5.0000 mg | ORAL_TABLET | ORAL | Status: DC | PRN
  Administered 2013-08-14 (×4): 5 mg via ORAL
  Administered 2013-08-15 – 2013-08-16 (×7): 10 mg via ORAL
  Filled 2013-08-13 (×4): qty 2
  Filled 2013-08-13: qty 1
  Filled 2013-08-13 (×2): qty 2
  Filled 2013-08-13 (×2): qty 1
  Filled 2013-08-13: qty 2
  Filled 2013-08-13: qty 1

## 2013-08-13 MED ORDER — ROCURONIUM BROMIDE 50 MG/5ML IV SOLN
INTRAVENOUS | Status: AC
  Administered 2013-08-13: 100 mg
  Filled 2013-08-13: qty 2

## 2013-08-13 MED ORDER — SODIUM CHLORIDE 0.9 % IV BOLUS (SEPSIS)
1000.0000 mL | Freq: Once | INTRAVENOUS | Status: AC
  Administered 2013-08-13: 1000 mL via INTRAVENOUS

## 2013-08-13 MED ORDER — BISACODYL 10 MG RE SUPP: 10.0000 mg | Freq: Every day | RECTAL | Status: DC

## 2013-08-13 MED ORDER — BISACODYL 5 MG PO TBEC
10.0000 mg | DELAYED_RELEASE_TABLET | Freq: Every day | ORAL | Status: DC
  Administered 2013-08-14 – 2013-08-16 (×3): 10 mg via ORAL
  Filled 2013-08-13 (×3): qty 2

## 2013-08-13 MED ORDER — SUCCINYLCHOLINE CHLORIDE 20 MG/ML IJ SOLN
INTRAMUSCULAR | Status: AC
  Filled 2013-08-13: qty 1

## 2013-08-13 MED ORDER — PANTOPRAZOLE SODIUM 40 MG PO TBEC
40.0000 mg | DELAYED_RELEASE_TABLET | Freq: Every day | ORAL | Status: DC
  Administered 2013-08-15 – 2013-08-17 (×3): 40 mg via ORAL
  Filled 2013-08-13 (×3): qty 1

## 2013-08-13 MED ORDER — HEMOSTATIC AGENTS (NO CHARGE) OPTIME
TOPICAL | Status: DC | PRN
  Administered 2013-08-13: 2 via TOPICAL

## 2013-08-13 MED ORDER — MIDAZOLAM HCL 2 MG/2ML IJ SOLN
2.0000 mg | INTRAMUSCULAR | Status: DC | PRN
  Administered 2013-08-15: 2 mg via INTRAVENOUS
  Filled 2013-08-13 (×2): qty 2

## 2013-08-13 MED ORDER — ONDANSETRON HCL 4 MG/2ML IJ SOLN: 4.0000 mg | Freq: Four times a day (QID) | INTRAMUSCULAR | Status: DC | PRN

## 2013-08-13 MED ORDER — ETOMIDATE 2 MG/ML IV SOLN
20.0000 mg/kg | Freq: Once | INTRAVENOUS | Status: AC
  Administered 2013-08-13: 20 mg via INTRAVENOUS

## 2013-08-13 MED ORDER — LACTATED RINGERS IV SOLN
INTRAVENOUS | Status: DC | PRN
  Administered 2013-08-13: 10:00:00 via INTRAVENOUS

## 2013-08-13 MED FILL — Calcium Chloride Inj 10%: INTRAVENOUS | Qty: 10 | Status: AC

## 2013-08-13 MED FILL — Electrolyte-R (PH 7.4) Solution: INTRAVENOUS | Qty: 1000 | Status: AC

## 2013-08-13 MED FILL — Sodium Bicarbonate IV Soln 8.4%: INTRAVENOUS | Qty: 50 | Status: AC

## 2013-08-13 MED FILL — Heparin Sodium (Porcine) Inj 1000 Unit/ML: INTRAMUSCULAR | Qty: 10 | Status: AC

## 2013-08-13 MED FILL — Mannitol IV Soln 20%: INTRAVENOUS | Qty: 500 | Status: AC

## 2013-08-13 MED FILL — Sodium Chloride IV Soln 0.9%: INTRAVENOUS | Qty: 3000 | Status: AC

## 2013-08-13 SURGICAL SUPPLY — 95 items
ADAPTER CARDIO PERF ANTE/RETRO (ADAPTER) IMPLANT
ATTRACTOMAT 16X20 MAGNETIC DRP (DRAPES) ×5 IMPLANT
BAG DECANTER FOR FLEXI CONT (MISCELLANEOUS) ×5 IMPLANT
BANDAGE ELASTIC 4 VELCRO ST LF (GAUZE/BANDAGES/DRESSINGS) IMPLANT
BANDAGE ELASTIC 6 VELCRO ST LF (GAUZE/BANDAGES/DRESSINGS) IMPLANT
BANDAGE GAUZE ELAST BULKY 4 IN (GAUZE/BANDAGES/DRESSINGS) IMPLANT
BASKET HEART  (ORDER IN 25'S) (MISCELLANEOUS)
BASKET HEART (ORDER IN 25'S) (MISCELLANEOUS)
BASKET HEART (ORDER IN 25S) (MISCELLANEOUS) IMPLANT
BLADE STERNUM SYSTEM 6 (BLADE) ×5 IMPLANT
BLADE SURG 12 STRL SS (BLADE) ×5 IMPLANT
BLADE SURG ROTATE 9660 (MISCELLANEOUS) ×5 IMPLANT
CANISTER SUCTION 2500CC (MISCELLANEOUS) ×5 IMPLANT
CANNULA GUNDRY RCSP 15FR (MISCELLANEOUS) IMPLANT
CANNULA VENOUS LOW PROF 32X40 (CANNULA) IMPLANT
CARDIAC SUCTION (MISCELLANEOUS) IMPLANT
CATH CPB KIT VANTRIGT (MISCELLANEOUS) ×5 IMPLANT
CATH ROBINSON RED A/P 18FR (CATHETERS) ×15 IMPLANT
CATH THORACIC 36FR RT ANG (CATHETERS) ×5 IMPLANT
CLIP TI WIDE RED SMALL 24 (CLIP) IMPLANT
COVER SURGICAL LIGHT HANDLE (MISCELLANEOUS) ×5 IMPLANT
CRADLE DONUT ADULT HEAD (MISCELLANEOUS) ×5 IMPLANT
DRAIN CHANNEL 32F RND 10.7 FF (WOUND CARE) ×5 IMPLANT
DRAPE CARDIOVASCULAR INCISE (DRAPES) ×2
DRAPE SLUSH/WARMER DISC (DRAPES) ×5 IMPLANT
DRAPE SRG 135X102X78XABS (DRAPES) ×3 IMPLANT
DRSG AQUACEL AG ADV 3.5X14 (GAUZE/BANDAGES/DRESSINGS) ×5 IMPLANT
ELECT BLADE 4.0 EZ CLEAN MEGAD (MISCELLANEOUS) ×5
ELECT BLADE 6.5 EXT (BLADE) ×5 IMPLANT
ELECT CAUTERY BLADE 6.4 (BLADE) ×5 IMPLANT
ELECT REM PT RETURN 9FT ADLT (ELECTROSURGICAL) ×10
ELECTRODE BLDE 4.0 EZ CLN MEGD (MISCELLANEOUS) ×3 IMPLANT
ELECTRODE REM PT RTRN 9FT ADLT (ELECTROSURGICAL) ×6 IMPLANT
GLOVE BIO SURGEON STRL SZ7.5 (GLOVE) ×15 IMPLANT
GLOVE BIOGEL PI IND STRL 7.0 (GLOVE) ×21 IMPLANT
GLOVE BIOGEL PI INDICATOR 7.0 (GLOVE) ×14
GOWN STRL REUS W/ TWL LRG LVL3 (GOWN DISPOSABLE) ×12 IMPLANT
GOWN STRL REUS W/TWL LRG LVL3 (GOWN DISPOSABLE) ×8
HEMOSTAT POWDER SURGIFOAM 1G (HEMOSTASIS) ×15 IMPLANT
HEMOSTAT SURGICEL 2X14 (HEMOSTASIS) ×5 IMPLANT
INSERT FOGARTY XLG (MISCELLANEOUS) IMPLANT
KIT BASIN OR (CUSTOM PROCEDURE TRAY) ×5 IMPLANT
KIT ROOM TURNOVER OR (KITS) ×5 IMPLANT
KIT SUCTION CATH 14FR (SUCTIONS) ×5 IMPLANT
KIT VASOVIEW W/TROCAR VH 2000 (KITS) IMPLANT
LEAD PACING MYOCARDI (MISCELLANEOUS) ×5 IMPLANT
LINE VENT (MISCELLANEOUS) ×5 IMPLANT
MARKER GRAFT CORONARY BYPASS (MISCELLANEOUS) IMPLANT
NS IRRIG 1000ML POUR BTL (IV SOLUTION) ×25 IMPLANT
PACK OPEN HEART (CUSTOM PROCEDURE TRAY) ×5 IMPLANT
PAD ARMBOARD 7.5X6 YLW CONV (MISCELLANEOUS) ×10 IMPLANT
PAD ELECT DEFIB RADIOL ZOLL (MISCELLANEOUS) ×5 IMPLANT
PENCIL BUTTON HOLSTER BLD 10FT (ELECTRODE) IMPLANT
PUNCH AORTIC ROTATE 4.0MM (MISCELLANEOUS) IMPLANT
PUNCH AORTIC ROTATE 4.5MM 8IN (MISCELLANEOUS) IMPLANT
PUNCH AORTIC ROTATE 5MM 8IN (MISCELLANEOUS) IMPLANT
SET CARDIOPLEGIA MPS 5001102 (MISCELLANEOUS) ×5 IMPLANT
SPONGE GAUZE 4X4 12PLY (GAUZE/BANDAGES/DRESSINGS) ×5 IMPLANT
STOPCOCK 4 WAY LG BORE MALE ST (IV SETS) ×5 IMPLANT
SURGIFLO W/THROMBIN 8M KIT (HEMOSTASIS) IMPLANT
SUT BONE WAX W31G (SUTURE) ×5 IMPLANT
SUT MNCRL AB 4-0 PS2 18 (SUTURE) IMPLANT
SUT PROLENE 3 0 SH DA (SUTURE) IMPLANT
SUT PROLENE 3 0 SH1 36 (SUTURE) IMPLANT
SUT PROLENE 4 0 RB 1 (SUTURE) ×2
SUT PROLENE 4 0 SH DA (SUTURE) ×10 IMPLANT
SUT PROLENE 4-0 RB1 .5 CRCL 36 (SUTURE) ×3 IMPLANT
SUT PROLENE 5 0 C 1 36 (SUTURE) IMPLANT
SUT PROLENE 6 0 C 1 30 (SUTURE) ×10 IMPLANT
SUT PROLENE 6 0 CC (SUTURE) ×15 IMPLANT
SUT PROLENE 8 0 BV175 6 (SUTURE) IMPLANT
SUT PROLENE BLUE 7 0 (SUTURE) ×5 IMPLANT
SUT SILK  1 MH (SUTURE)
SUT SILK 1 MH (SUTURE) IMPLANT
SUT SILK 2 0 SH CR/8 (SUTURE) IMPLANT
SUT SILK 3 0 SH CR/8 (SUTURE) IMPLANT
SUT STEEL 6MS V (SUTURE) ×10 IMPLANT
SUT STEEL SZ 6 DBL 3X14 BALL (SUTURE) ×5 IMPLANT
SUT VIC AB 1 CTX 36 (SUTURE) ×4
SUT VIC AB 1 CTX36XBRD ANBCTR (SUTURE) ×6 IMPLANT
SUT VIC AB 2-0 CT1 27 (SUTURE)
SUT VIC AB 2-0 CT1 TAPERPNT 27 (SUTURE) IMPLANT
SUT VIC AB 2-0 CTX 27 (SUTURE) IMPLANT
SUT VIC AB 3-0 X1 27 (SUTURE) IMPLANT
SUTURE E-PAK OPEN HEART (SUTURE) ×5 IMPLANT
SYSTEM SAHARA CHEST DRAIN ATS (WOUND CARE) ×5 IMPLANT
TAPE CLOTH SURG 4X10 WHT LF (GAUZE/BANDAGES/DRESSINGS) ×5 IMPLANT
TOWEL OR 17X24 6PK STRL BLUE (TOWEL DISPOSABLE) ×10 IMPLANT
TOWEL OR 17X26 10 PK STRL BLUE (TOWEL DISPOSABLE) ×10 IMPLANT
TRAY CATH LUMEN 1 20CM STRL (SET/KITS/TRAYS/PACK) ×5 IMPLANT
TRAY FOLEY IC TEMP SENS 16FR (CATHETERS) ×5 IMPLANT
TUBING ART PRESS 48 MALE/FEM (TUBING) ×10 IMPLANT
TUBING INSUFFLATION 10FT LAP (TUBING) IMPLANT
UNDERPAD 30X30 INCONTINENT (UNDERPADS AND DIAPERS) ×5 IMPLANT
WATER STERILE IRR 1000ML POUR (IV SOLUTION) ×10 IMPLANT

## 2013-08-13 NOTE — ED Notes (Signed)
EDP at bedside attempting to intubate.
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
## 2013-08-13 NOTE — Progress Notes (Signed)
Called MD about patient's CT output, between 1300-1400, output was 210cc, updated MD that MAPs were in the 100s, nitro drip was infusing, PEEP was increased to 8 already.  Labs and XRAY reviewed, patient has frequent PVCs beats. Will monitor  Orders received: FFP x 1 10packs of PLTs DDAVP 8mcg Increase PEEP to 10 4 runs of K (K was 3.1)

## 2013-08-13 NOTE — ED Notes (Addendum)
1st unit of O negative blood started with pressure bag.
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
## 2013-08-13 NOTE — Progress Notes (Signed)
Code Bradley Cooper came to ED, patient and family were unavailable.     Charyl Dancer, chaplain
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
## 2013-08-13 NOTE — Progress Notes (Signed)
Pt taken off vent and bagged to OR.

## 2013-08-13 NOTE — ED Notes (Addendum)
1st unit of FFP started with pressure bag.
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
## 2013-08-13 NOTE — ED Notes (Signed)
1 pair of pants, 1 black belt, 1 pair of tennis shoes, and 1 pair of sweat pants, all belongings placed in brown paper bags and given to [employer].
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
## 2013-08-13 NOTE — Progress Notes (Signed)
CSW was requested by [employer] Supervisor to assist her with the family in the ED.  It was reported that the patient did not have any listed family on his profile and he is currently unable to communicate any requests.  CSW received some guidance from [facility] who suggested an interview of the suspected parents of the patient for clarity.  CSW met with the family in the ED consult room.  CSW introduced self and clearly explained the importance of this line of questioning.  The parents were able to provide state photo ID, patient's full name, patient's date of birth, and their address match the patient's.  CSW was instructed by the current House Supervisor to allow them to the unit and CSW escorted them.  CSW spoke with Puja, RN and provided her with a photo copy of the parent's ID then introduced the parents.  No further social work involvement is needed at this time.      Chesley Noon, MSW, [place], 08/13/2013 Evening Clinical Social Worker [phone]

## 2013-08-13 NOTE — Progress Notes (Signed)
  Echocardiogram Echocardiogram Transesophageal has been performed.  Bradley Cooper 08/13/2013, 9:57 AM

## 2013-08-13 NOTE — ED Notes (Addendum)
2nd unit of O negative blood started with pressure bag.

## 2013-08-13 NOTE — Progress Notes (Signed)
Patient ID: Bradley Cooper, male   DOB: 03-09-1971, 43 y.o.   MRN: [id number]  SICU Evening Rounds:  Hemodynamically stable  Intubated on vent: FiO2 decreased to 70%, sats 100  Excellent urine output  CT output low.  Continue ventilator support for now.

## 2013-08-13 NOTE — Progress Notes (Signed)
Utilization Review Completed.Shandie Bertz T Dowell6/04/2013  
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
## 2013-08-13 NOTE — ED Notes (Signed)
RSI successful, positive color change, bilateral equal breath sounds noted. 7.5 ET tube, 24 @ lip.

## 2013-08-13 NOTE — Progress Notes (Signed)
Use Cuff BP to titrate drips, keep SBP  Less than 140 per MD

## 2013-08-13 NOTE — Progress Notes (Signed)
RT note- increased peep 8, bleeding and low p02 on 100%, B/P 140's.
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
## 2013-08-13 NOTE — Transfer of Care (Signed)
Immediate Anesthesia Transfer of Care Note  Patient: Bradley Cooper  Procedure(s) Performed: Procedure(s) with comments: [place]; PLACEMENT OF RIGHT FEMORAL ARTERIAL LINE - Repair of right ventricle INTRAOPERATIVE TRANSESOPHAGEAL ECHOCARDIOGRAM (N/A)  Patient Location: SICU  Anesthesia Type:General  Level of Consciousness: Patient remains intubated per anesthesia plan  Airway & Oxygen Therapy: Patient remains intubated per anesthesia plan and Patient placed on Ventilator (see vital sign flow sheet for setting)  Post-op Assessment: Report given to PACU RN  Post vital signs: Reviewed and stable  Complications: No apparent anesthesia complications
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
## 2013-08-13 NOTE — ED Notes (Signed)
FAST exam performed by Dr. Grandville Silos was negative.

## 2013-08-14 ENCOUNTER — Inpatient Hospital Stay (HOSPITAL_COMMUNITY): Payer: No Typology Code available for payment source

## 2013-08-14 ENCOUNTER — Other Ambulatory Visit: Payer: Self-pay

## 2013-08-14 ENCOUNTER — Encounter (HOSPITAL_COMMUNITY): Payer: Self-pay | Admitting: Anesthesiology

## 2013-08-14 DIAGNOSIS — D62 Acute posthemorrhagic anemia: Secondary | ICD-10-CM

## 2013-08-14 DIAGNOSIS — J95821 Acute postprocedural respiratory failure: Secondary | ICD-10-CM

## 2013-08-14 LAB — GLUCOSE, CAPILLARY
GLUCOSE-CAPILLARY: 124 mg/dL — AB (ref 70–99)
GLUCOSE-CAPILLARY: 128 mg/dL — AB (ref 70–99)
Glucose-Capillary: 135 mg/dL — ABNORMAL HIGH (ref 70–99)
Glucose-Capillary: 123 mg/dL — ABNORMAL HIGH (ref 70–99)
Glucose-Capillary: 115 mg/dL — ABNORMAL HIGH (ref 70–99)
Glucose-Capillary: 102 mg/dL — ABNORMAL HIGH (ref 70–99)

## 2013-08-14 LAB — PREPARE FRESH FROZEN PLASMA
UNIT DIVISION: 0
UNIT DIVISION: 0
UNIT DIVISION: 0
UNIT DIVISION: 0
UNIT DIVISION: 0
Unit division: 0
Unit division: 0
Unit division: 0
Unit division: 0

## 2013-08-14 LAB — PREPARE PLATELET PHERESIS
Unit division: 0
Unit division: 0

## 2013-08-14 LAB — COMPREHENSIVE METABOLIC PANEL
ALT: 209 U/L — ABNORMAL HIGH (ref 0–53)
AST: 241 U/L — ABNORMAL HIGH (ref 0–37)
Albumin: 2.9 g/dL — ABNORMAL LOW (ref 3.5–5.2)
Alkaline Phosphatase: 80 U/L (ref 39–117)
BUN: 9 mg/dL (ref 6–23)
CO2: 23 mEq/L (ref 19–32)
Calcium: 8.2 mg/dL — ABNORMAL LOW (ref 8.4–10.5)
Chloride: 105 mEq/L (ref 96–112)
Creatinine, Ser: 1.01 mg/dL (ref 0.50–1.35)
GFR calc Af Amer: >90 mL/min (ref >90)
GFR calc non Af Amer: 89 mL/min — ABNORMAL LOW (ref >90)
Glucose, Bld: 146 mg/dL — ABNORMAL HIGH (ref 70–99)
Potassium: 4 mEq/L (ref 3.7–5.3)
Sodium: 139 mEq/L (ref 137–147)
Total Bilirubin: 2.3 mg/dL — ABNORMAL HIGH (ref 0.3–1.2)
Total Protein: 5.4 g/dL — ABNORMAL LOW (ref 6.0–8.3)

## 2013-08-14 LAB — CBC
HCT: 38.1 % — ABNORMAL LOW (ref 39.0–52.0)
HCT: 39.4 % (ref 39.0–52.0)
HEMOGLOBIN: 13.2 g/dL (ref 13.0–17.0)
HEMOGLOBIN: 13.7 g/dL (ref 13.0–17.0)
MCH: 29.6 pg (ref 26.0–34.0)
MCH: 30.2 pg (ref 26.0–34.0)
MCHC: 34.6 g/dL (ref 30.0–36.0)
MCHC: 34.8 g/dL (ref 30.0–36.0)
MCV: 85.4 fL (ref 78.0–100.0)
MCV: 86.8 fL (ref 78.0–100.0)
Platelets: 148 10*3/uL — ABNORMAL LOW (ref 150–400)
Platelets: 151 10*3/uL (ref 150–400)
RBC: 4.46 MIL/uL (ref 4.22–5.81)
RBC: 4.54 MIL/uL (ref 4.22–5.81)
RDW: 13.9 % (ref 11.5–15.5)
RDW: 14 % (ref 11.5–15.5)
WBC: 8.4 10*3/uL (ref 4.0–10.5)
WBC: 12.2 10*3/uL — AB (ref 4.0–10.5)

## 2013-08-14 LAB — POCT I-STAT 3, ART BLOOD GAS (G3+)
Acid-base deficit: 4 mmol/L — ABNORMAL HIGH (ref 0.0–2.0)
BICARBONATE: 20.2 meq/L (ref 20.0–24.0)
O2 Saturation: 99 %
PO2 ART: 143 mmHg — AB (ref 80.0–100.0)
TCO2: 21 mmol/L (ref 0–100)
pCO2 arterial: 35.5 mmHg (ref 35.0–45.0)
pH, Arterial: 7.367 (ref 7.350–7.450)

## 2013-08-14 LAB — BLOOD GAS, ARTERIAL
Acid-base deficit: 2 mmol/L (ref 0.0–2.0)
Bicarbonate: 21.6 mEq/L (ref 20.0–24.0)
Drawn by: 252031
FIO2: 0.4 %
MECHVT: 550 mL
O2 Saturation: 99.5 %
PEEP: 10 cmH2O
Patient temperature: 98.6
RATE: 16 resp/min
TCO2: 22.6 mmol/L (ref 0–100)
pCO2 arterial: 33 mmHg — ABNORMAL LOW (ref 35.0–45.0)
pH, Arterial: 7.432 (ref 7.350–7.450)
pO2, Arterial: 156 mmHg — ABNORMAL HIGH (ref 80.0–100.0)

## 2013-08-14 LAB — POCT I-STAT, CHEM 8
BUN: 4 mg/dL — ABNORMAL LOW (ref 6–23)
CALCIUM ION: 1.2 mmol/L (ref 1.12–1.23)
CHLORIDE: 97 meq/L (ref 96–112)
Creatinine, Ser: 0.9 mg/dL (ref 0.50–1.35)
Glucose, Bld: 114 mg/dL — ABNORMAL HIGH (ref 70–99)
HEMATOCRIT: 47 % (ref 39.0–52.0)
Hemoglobin: 16 g/dL (ref 13.0–17.0)
Potassium: 4.1 mEq/L (ref 3.7–5.3)
Sodium: 138 mEq/L (ref 137–147)
TCO2: 27 mmol/L (ref 0–100)

## 2013-08-14 LAB — CREATININE, SERUM: Creatinine, Ser: 0.85 mg/dL (ref 0.50–1.35)

## 2013-08-14 LAB — MAGNESIUM
MAGNESIUM: 2.1 mg/dL (ref 1.5–2.5)
Magnesium: 2.1 mg/dL (ref 1.5–2.5)

## 2013-08-14 MED ORDER — DEXMEDETOMIDINE HCL IN NACL 200 MCG/50ML IV SOLN: 0.2000 ug/kg/h | INTRAVENOUS | Status: DC

## 2013-08-14 MED ORDER — INSULIN ASPART 100 UNIT/ML ~~LOC~~ SOLN
0.0000 [IU] | SUBCUTANEOUS | Status: DC
  Administered 2013-08-14 – 2013-08-15 (×4): 2 [IU] via SUBCUTANEOUS
  Administered 2013-08-15: 4 [IU] via SUBCUTANEOUS
  Administered 2013-08-15 – 2013-08-16 (×2): 2 [IU] via SUBCUTANEOUS

## 2013-08-14 MED FILL — Magnesium Sulfate Inj 50%: INTRAMUSCULAR | Qty: 10 | Status: AC

## 2013-08-14 MED FILL — Potassium Chloride Inj 2 mEq/ML: INTRAVENOUS | Qty: 40 | Status: AC

## 2013-08-14 MED FILL — Heparin Sodium (Porcine) Inj 1000 Unit/ML: INTRAMUSCULAR | Qty: 30 | Status: AC

## 2013-08-14 NOTE — Progress Notes (Signed)
Up in chair, visiting with family  BP 123/78  Pulse 80  Temp(Src) 98.7 F (37.1 C) (Oral)  Resp 17  Ht 5\' 8"  (1.727 m)  Wt 176 lb 9.4 oz (80.1 kg)  BMI 26.86 kg/m2  SpO2 100%   Intake/Output Summary (Last 24 hours) at 08/14/13 1832 Last data filed at 08/14/13 1741  Gross per 24 hour  Intake 2023.2 ml  Output   2185 ml  Net -161.8 ml   300 ml from CT since this AM  K= 4.1, HCT = 40
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
## 2013-08-14 NOTE — Clinical Documentation Improvement (Signed)
Possible Clinical Conditions?    Encephalopathy (describe type if known)                       Anoxic                       Septic                       Alcoholic                        Hepatic                       Hypertensive                       Metabolic                       Toxic Traumatic brain injury Traumatic brain hemorrhage Drug induced delirium  Hyponatremia / Hypernatremia Poisoning / Overdose Other Condition Cannot Clinically Determine    Risk Factors: AMS noted per 6/02 progress notes.    Thank You, Theron Arista, Clinical Documentation Specialist:  [phone]  [facility] Information [facility]

## 2013-08-14 NOTE — Progress Notes (Signed)
Follow up - Trauma and Critical Care  Patient Details:    Bradley Cooper is an 43 y.o. male.  Lines/tubes : Airway 7.5 mm (Active)  Secured at (cm) 24 cm 08/14/2013  3:00 AM  Measured From Lips 08/14/2013  3:00 AM  Secured Location Center 08/14/2013  3:00 AM  Secured By [employer] 08/14/2013  3:00 AM  Tube Holder Repositioned Yes 08/14/2013  3:00 AM  Site Condition Dry 08/14/2013  3:00 AM     Arterial Line 08/13/13 Right Femoral (Active)  Site Assessment Intact;Dry;Clean 08/14/2013  7:30 AM  Line Status Pulsatile blood flow 08/14/2013  7:30 AM  Art Line Waveform Appropriate 08/14/2013  7:30 AM  Art Line Interventions Zeroed and calibrated;Leveled 08/14/2013  7:30 AM  Color/Movement/Sensation Capillary refill less than 3 sec 08/14/2013  7:30 AM  Dressing Type Transparent 08/14/2013  7:30 AM  Dressing Status Clean;Dry;Intact 08/14/2013  7:30 AM  Interventions New dressing 08/13/2013  3:00 PM     Theone Murdoch 08/13/13 Right  (Active)  Centimeter marking at introducer exit site 45 cm 08/14/2013  7:30 AM  Site Assessment Clean;Dry;Intact 08/14/2013  7:30 AM  Line Care Connections checked and tightened 08/14/2013  7:30 AM  Theone Murdoch Wave Form Appropriate wave forms 08/14/2013  7:30 AM  Proximal Port Infusing 08/14/2013  7:30 AM  Distal Port Infusing 08/14/2013  7:30 AM  Balloon Deflated? Yes 08/14/2013  7:30 AM     NG/OG Tube Orogastric (Active)  Placement Verification Auscultation 08/14/2013  7:30 AM  Site Assessment Clean;Dry;Intact 08/14/2013  7:30 AM  Status Suction-low intermittent 08/14/2013  7:30 AM  Drainage Appearance Bile;Brown 08/14/2013  7:30 AM  Intake (mL) 30 mL 08/13/2013  8:00 PM     Urethral Catheter Waldemar Dickens, RN Temperature probe 16 Fr. (Active)  Indication for Insertion or Continuance of Catheter Unstable critical patients (first 24-48 hours) 08/14/2013  7:30 AM  Site Assessment Intact;Clean 08/14/2013  7:30 AM  Catheter Maintenance Bag below level of bladder;Bag emptied prior to transport;Seal  intact;No dependent loops;Insertion date on drainage bag;Drainage bag/tubing not touching floor;Catheter secured 08/14/2013  7:30 AM  Collection Container Standard drainage bag 08/14/2013  7:30 AM  Securement Method Leg strap 08/14/2013  7:30 AM  Output (mL) 125 mL 08/14/2013  7:00 AM     Y Chest Tube 1, 2, and 3 1 Right Pleural 36 Fr. 2 Left Pleural 36 Fr. 3 Medial Mediastinal 32 Fr. (Active)  Suction -20 cm H2O 08/14/2013  7:30 AM  Chest Tube Air Leak None 08/14/2013  7:30 AM  Patency Intervention Tip/tilt 08/13/2013  8:00 PM  Drainage Description 1 Serosanguineous 08/14/2013  7:30 AM  Dressing Status 1 Clean;Dry;Intact 08/14/2013  7:30 AM  Dressing Intervention 1 New dressing 08/13/2013  1:25 PM  Site Assessment 1 Not assessed 08/14/2013  7:30 AM  Surrounding Skin 1 Unable to view 08/14/2013  7:30 AM  Drainage Description 2 Serosanguineous 08/14/2013  7:30 AM  Dressing Status 2 Dry;Clean;Intact 08/14/2013  7:30 AM  Dressing Intervention 2 New dressing 08/13/2013  1:25 PM  Site Assessment 2 Not assessed 08/14/2013  7:30 AM  Surrounding Skin 2 Unable to view 08/14/2013  7:30 AM  Drainage Description 3 Serosanguineous 08/14/2013  7:30 AM  Dressing Status 3 Clean;Dry;Intact 08/14/2013  7:30 AM  Dressing Intervention 3 New dressing 08/13/2013  1:25 PM  Site Assessment 3 Not assessed 08/14/2013  7:30 AM  Surrounding Skin 3 Unable to view 08/14/2013  7:30 AM  Output (mL) 30 mL 08/14/2013  7:00 AM  Microbiology/Sepsis markers: No results found for this or any previous visit.  Anti-infectives:  Anti-infectives   Start     Dose/Rate Route Frequency Ordered Stop   08/13/13 1930  vancomycin (VANCOCIN) IVPB 1000 mg/200 mL premix     1,000 mg 200 mL/hr over 60 Minutes Intravenous  Once 08/13/13 1249 08/13/13 2103   08/13/13 1800  cefUROXime (ZINACEF) 1.5 g in dextrose 5 % 50 mL IVPB     1.5 g 100 mL/hr over 30 Minutes Intravenous Every 12 hours 08/13/13 1249 08/15/13 1759   08/13/13 1300  vancomycin (VANCOCIN) 1,250 mg in sodium  chloride 0.9 % 250 mL IVPB  Status:  Discontinued     1,250 mg 166.7 mL/hr over 90 Minutes Intravenous To Surgery 08/13/13 1248 08/13/13 1302   08/13/13 1300  cefUROXime (ZINACEF) 1.5 g in dextrose 5 % 50 mL IVPB  Status:  Discontinued     1.5 g 100 mL/hr over 30 Minutes Intravenous To Surgery 08/13/13 1248 08/13/13 1301   08/13/13 0945  vancomycin (VANCOCIN) IVPB 1000 mg/200 mL premix     1,000 mg 200 mL/hr over 60 Minutes Intravenous To Surgery 08/13/13 0931 08/13/13 1030   08/13/13 0945  cefUROXime (ZINACEF) 1.5 g in dextrose 5 % 50 mL IVPB     1.5 g 100 mL/hr over 30 Minutes Intravenous To Surgery 08/13/13 0933 08/13/13 1057   08/13/13 0930  cefUROXime (ZINACEF) 750 mg in dextrose 5 % 50 mL IVPB  Status:  Discontinued     750 mg 100 mL/hr over 30 Minutes Intravenous To Surgery 08/13/13 [id number] 08/13/13 1249      Best Practice/Protocols:  VTE Prophylaxis: Mechanical GI Prophylaxis: Proton Pump Inhibitor Continous Sedation  Consults: Treatment Team:  Ivin Poot, MD    Events:  Subjective:    Overnight Issues: Patient has done well overnight.  Weaning from the ventilator.  Awake and responsive.  Objective:  Vital signs for last 24 hours: Temp:  [96.3 F (35.7 C)-100.2 F (37.9 C)] 100 F (37.8 C) (06/03 0700) Pulse Rate:  [56-116] 80 (06/03 0700) Resp:  [12-25] 20 (06/03 0700) BP: (67-149)/(44-104) 119/73 mmHg (06/03 0700) SpO2:  [95 %-100 %] 100 % (06/03 0700) Arterial Line BP: (94-159)/(68-101) 133/71 mmHg (06/03 0700) FiO2 (%):  [40 %-100 %] 40 % (06/03 0400) Weight:  [68.04 kg (150 lb)-80.1 kg (176 lb 9.4 oz)] 80.1 kg (176 lb 9.4 oz) (06/03 0500)  Hemodynamic parameters for last 24 hours: PAP: (19-32)/(9-23) 19/9 mmHg CO:  [3.5 L/min-6.1 L/min] 6 L/min CI:  [1.9 L/min/m2-3.4 L/min/m2] 3.3 L/min/m2  Intake/Output from previous day: 06/02 0701 - 06/03 0700 In: 10635.6 [I.V.:6406.6; Blood:3149; NG/GT:30; IV Piggyback:1050] Out: 6770 [Urine:4270;  Blood:1800; Chest Tube:700]  Intake/Output this shift:    Vent settings for last 24 hours: Vent Mode:  [-] PRVC FiO2 (%):  [40 %-100 %] 40 % Set Rate:  [14 bmp-16 bmp] 16 bmp Vt Set:  [550 mL-580 mL] 550 mL PEEP:  [5 cmH20-10 cmH20] 5 cmH20 Plateau Pressure:  [21 [id number] cmH20] 22 cmH20  Physical Exam:  General: alert and no respiratory distress Neuro: alert, oriented and nonfocal exam Resp: clear to auscultation bilaterally CVS: regular rate and rhythm, S1, S2 normal, no murmur, click, rub or gallop Extremities: no edema, no erythema, pulses WNL and PAS hose intact  Results for orders placed during the hospital encounter of 08/13/13 (from the past 24 hour(s))  TYPE AND SCREEN     Status: None   Collection Time    08/13/13  8:04 AM      Result Value Ref Range   ABO/RH(D) B POS     Antibody Screen NEG     Sample Expiration 08/16/2013     Unit Number [id number]     Blood Component Type RBC LR PHER2     Unit division 00     Status of Unit ISSUED     Unit tag comment VERBAL ORDERS PER DR LOCKWOOD     Transfusion Status OK TO TRANSFUSE     Crossmatch Result COMPATIBLE     Unit Number [id number]     Blood Component Type RBC LR PHER1     Unit division 00     Status of Unit ISSUED     Unit tag comment VERBAL ORDERS PER DR LOCKWOOD     Transfusion Status OK TO TRANSFUSE     Crossmatch Result COMPATIBLE     Unit Number [id number]     Blood Component Type RED CELLS,LR     Unit division 00     Status of Unit ISSUED     Transfusion Status OK TO TRANSFUSE     Crossmatch Result Compatible     Unit Number [id number]     Blood Component Type RED CELLS,LR     Unit division 00     Status of Unit ISSUED     Transfusion Status OK TO TRANSFUSE     Crossmatch Result Compatible     Unit Number [id number]     Blood Component Type RED CELLS,LR     Unit division 00     Status of Unit ISSUED     Transfusion Status OK TO TRANSFUSE     Crossmatch Result Compatible      Unit Number [id number]     Blood Component Type RED CELLS,LR     Unit division 00     Status of Unit REL FROM [facility]     Transfusion Status OK TO TRANSFUSE     Crossmatch Result Compatible     Unit Number [id number]     Blood Component Type RED CELLS,LR     Unit division 00     Status of Unit REL FROM [facility]     Transfusion Status OK TO TRANSFUSE     Crossmatch Result Compatible     Unit Number [id number]     Blood Component Type RED CELLS,LR     Unit division 00     Status of Unit REL FROM [facility]     Transfusion Status OK TO TRANSFUSE     Crossmatch Result Compatible     Unit Number [id number]     Blood Component Type RED CELLS,LR     Unit division 00     Status of Unit REL FROM [facility]     Transfusion Status OK TO TRANSFUSE     Crossmatch Result Compatible     Unit Number [id number]     Blood Component Type RED CELLS,LR     Unit division 00     Status of Unit REL FROM [facility]     Transfusion Status OK TO TRANSFUSE     Crossmatch Result Compatible     Unit Number [id number]     Blood Component Type RED CELLS,LR     Unit division 00     Status of Unit REL FROM [facility]     Transfusion Status OK TO TRANSFUSE     Crossmatch Result Compatible     Unit Number [id number]     Blood Component Type  RED CELLS,LR     Unit division 00     Status of Unit ALLOCATED     Transfusion Status OK TO TRANSFUSE     Crossmatch Result Compatible     Unit Number [id number]     Blood Component Type RED CELLS,LR     Unit division 00     Status of Unit REL FROM [facility]     Transfusion Status OK TO TRANSFUSE     Crossmatch Result Compatible     Unit Number [id number]     Blood Component Type RED CELLS,LR     Unit division 00     Status of Unit REL FROM [facility]     Transfusion Status OK TO TRANSFUSE     Crossmatch Result Compatible     Unit Number [id number]     Blood Component Type RED CELLS,LR     Unit division 00     Status of Unit REL FROM [facility]     Transfusion Status  OK TO TRANSFUSE     Crossmatch Result Compatible     Unit Number [id number]     Blood Component Type RED CELLS,LR     Unit division 00     Status of Unit REL FROM [facility]     Transfusion Status OK TO TRANSFUSE     Crossmatch Result Compatible     Unit Number [id number]     Blood Component Type RED CELLS,LR     Unit division 00     Status of Unit ALLOCATED     Transfusion Status OK TO TRANSFUSE     Crossmatch Result Compatible     Unit Number [id number]     Blood Component Type RED CELLS,LR     Unit division 00     Status of Unit ALLOCATED     Transfusion Status OK TO TRANSFUSE     Crossmatch Result Compatible     Unit Number [id number]     Blood Component Type RED CELLS,LR     Unit division 00     Status of Unit ALLOCATED     Transfusion Status OK TO TRANSFUSE     Crossmatch Result Compatible     Unit Number [id number]     Blood Component Type RED CELLS,LR     Unit division 00     Status of Unit ALLOCATED     Transfusion Status OK TO TRANSFUSE     Crossmatch Result Compatible    PREPARE FRESH FROZEN PLASMA     Status: None   Collection Time    08/13/13  8:05 AM      Result Value Ref Range   Unit Number [id number]     Blood Component Type THAWED PLASMA     Unit division 00     Status of Unit ISSUED     Unit tag comment VERBAL ORDERS PER DR LOCKWOOD     Transfusion Status OK TO TRANSFUSE     Unit Number [id number]     Blood Component Type THAWED PLASMA     Unit division 00     Status of Unit ISSUED     Unit tag comment VERBAL ORDERS PER DR LOCKWOOD     Transfusion Status OK TO TRANSFUSE     Unit Number [id number]     Blood Component Type THAWED PLASMA     Unit division 00     Status of Unit ISSUED     Unit tag comment VERBAL ORDERS PER DR Glennon Mac  Transfusion Status OK TO TRANSFUSE     Unit Number [id number]     Blood Component Type THAWED PLASMA     Unit division 00     Status of Unit REL FROM [facility]     Unit tag comment VERBAL  ORDERS PER DR Glennon Mac     Transfusion Status OK TO TRANSFUSE     Unit Number [id number]     Blood Component Type THAWED PLASMA     Unit division 00     Status of Unit REL FROM [facility]     Unit tag comment VERBAL ORDERS PER DR Glennon Mac     Transfusion Status OK TO TRANSFUSE     Unit Number [id number]     Blood Component Type THAWED PLASMA     Unit division 00     Status of Unit REL FROM [facility]     Unit tag comment VERBAL ORDERS PER DR Glennon Mac     Transfusion Status OK TO TRANSFUSE    ABO/RH     Status: None   Collection Time    08/13/13  8:20 AM      Result Value Ref Range   ABO/RH(D) B POS    I-STAT CHEM 8, ED     Status: Abnormal   Collection Time    08/13/13  8:39 AM      Result Value Ref Range   Sodium 141  137 - 147 mEq/L   Potassium 3.3 (*) 3.7 - 5.3 mEq/L   Chloride 103  96 - 112 mEq/L   BUN 14  6 - 23 mg/dL   Creatinine, Ser 1.50 (*) 0.50 - 1.35 mg/dL   Glucose, Bld 211 (*) 70 - 99 mg/dL   Calcium, Ion 1.20  1.12 - 1.23 mmol/L   TCO2 19  0 - 100 mmol/L   Hemoglobin 15.3  13.0 - 17.0 g/dL   HCT 45.0  39.0 - 52.0 %  PREPARE PLATELET PHERESIS     Status: None   Collection Time    08/13/13  9:30 AM      Result Value Ref Range   Unit Number [id number]     Blood Component Type PLTPHER LR2     Unit division 00     Status of Unit ISSUED     Transfusion Status OK TO TRANSFUSE    POCT I-STAT 4, (NA,K, GLUC, HGB,HCT)     Status: Abnormal   Collection Time    08/13/13  9:48 AM      Result Value Ref Range   Sodium 139  137 - 147 mEq/L   Potassium 5.5 (*) 3.7 - 5.3 mEq/L   Glucose, Bld 250 (*) 70 - 99 mg/dL   HCT 40.0  39.0 - 52.0 %   Hemoglobin 13.6  13.0 - 17.0 g/dL  I-STAT ARTERIAL BLOOD GAS, ED     Status: Abnormal   Collection Time    08/13/13  9:56 AM      Result Value Ref Range   pH, Arterial 7.169 (*) 7.350 - 7.450   pCO2 arterial 14.9 (*) 35.0 - 45.0 mmHg   pO2, Arterial 246.0 (*) 80.0 - 100.0 mmHg   Bicarbonate 5.4 (*) 20.0 - 24.0 mEq/L   TCO2 6  0 -  100 mmol/L   O2 Saturation 100.0     Acid-base deficit 21.0 (*) 0.0 - 2.0 mmol/L   Sample type ARTERIAL    HEMOGLOBIN AND HEMATOCRIT, BLOOD     Status: Abnormal   Collection Time  08/13/13 10:00 AM      Result Value Ref Range   Hemoglobin 8.1 (*) 13.0 - 17.0 g/dL   HCT 23.2 (*) 39.0 - 52.0 %  PLATELET COUNT     Status: Abnormal   Collection Time    08/13/13 10:00 AM      Result Value Ref Range   Platelets 97 (*) 150 - 400 K/uL  PREPARE RBC (CROSSMATCH)     Status: None   Collection Time    08/13/13 10:04 AM      Result Value Ref Range   Order Confirmation ORDER PROCESSED BY BLOOD BANK    POCT I-STAT 4, (NA,K, GLUC, HGB,HCT)     Status: Abnormal   Collection Time    08/13/13 10:20 AM      Result Value Ref Range   Sodium 141  137 - 147 mEq/L   Potassium 3.4 (*) 3.7 - 5.3 mEq/L   Glucose, Bld 185 (*) 70 - 99 mg/dL   HCT 39.0  39.0 - 52.0 %   Hemoglobin 13.3  13.0 - 17.0 g/dL  I-STAT ARTERIAL BLOOD GAS, ED     Status: Abnormal   Collection Time    08/13/13 10:23 AM      Result Value Ref Range   pH, Arterial 7.231 (*) 7.350 - 7.450   pCO2 arterial 42.4  35.0 - 45.0 mmHg   pO2, Arterial 294.0 (*) 80.0 - 100.0 mmHg   Bicarbonate 17.8 (*) 20.0 - 24.0 mEq/L   TCO2 19  0 - 100 mmol/L   O2 Saturation 100.0     Acid-base deficit 9.0 (*) 0.0 - 2.0 mmol/L   Sample type ARTERIAL    POCT I-STAT 4, (NA,K, GLUC, HGB,HCT)     Status: Abnormal   Collection Time    08/13/13 10:40 AM      Result Value Ref Range   Sodium 142  137 - 147 mEq/L   Potassium 3.8  3.7 - 5.3 mEq/L   Glucose, Bld 195 (*) 70 - 99 mg/dL   HCT 26.0 (*) 39.0 - 52.0 %   Hemoglobin 8.8 (*) 13.0 - 17.0 g/dL  I-STAT ARTERIAL BLOOD GAS, ED     Status: Abnormal   Collection Time    08/13/13 10:46 AM      Result Value Ref Range   pH, Arterial 7.338 (*) 7.350 - 7.450   pCO2 arterial 41.1  35.0 - 45.0 mmHg   pO2, Arterial 362.0 (*) 80.0 - 100.0 mmHg   Bicarbonate 22.0  20.0 - 24.0 mEq/L   TCO2 23  0 - 100 mmol/L    O2 Saturation 100.0     Acid-base deficit 4.0 (*) 0.0 - 2.0 mmol/L   Sample type ARTERIAL    POCT I-STAT 4, (NA,K, GLUC, HGB,HCT)     Status: Abnormal   Collection Time    08/13/13 11:15 AM      Result Value Ref Range   Sodium 143  137 - 147 mEq/L   Potassium 3.3 (*) 3.7 - 5.3 mEq/L   Glucose, Bld 157 (*) 70 - 99 mg/dL   HCT 29.0 (*) 39.0 - 52.0 %   Hemoglobin 9.9 (*) 13.0 - 17.0 g/dL  I-STAT ARTERIAL BLOOD GAS, ED     Status: Abnormal   Collection Time    08/13/13 11:19 AM      Result Value Ref Range   pH, Arterial 7.312 (*) 7.350 - 7.450   pCO2 arterial 45.5 (*) 35.0 - 45.0 mmHg   pO2, Arterial  67.0 (*) 80.0 - 100.0 mmHg   Bicarbonate 23.0  20.0 - 24.0 mEq/L   TCO2 24  0 - 100 mmol/L   O2 Saturation 91.0     Acid-base deficit 3.0 (*) 0.0 - 2.0 mmol/L   Sample type ARTERIAL    PREPARE FRESH FROZEN PLASMA     Status: None   Collection Time    08/13/13 11:22 AM      Result Value Ref Range   Unit Number [id number]     Blood Component Type THAWED PLASMA     Unit division 00     Status of Unit REL FROM [facility]     Transfusion Status OK TO TRANSFUSE     Unit Number [id number]     Blood Component Type THAWED PLASMA     Unit division 00     Status of Unit ISSUED     Transfusion Status OK TO TRANSFUSE     Unit Number [id number]     Blood Component Type THAWED PLASMA     Unit division 00     Status of Unit REL FROM [facility]     Transfusion Status OK TO TRANSFUSE    POCT I-STAT 4, (NA,K, GLUC, HGB,HCT)     Status: Abnormal   Collection Time    08/13/13  1:06 PM      Result Value Ref Range   Sodium 146  137 - 147 mEq/L   Potassium 3.1 (*) 3.7 - 5.3 mEq/L   Glucose, Bld 35 (*) 70 - 99 mg/dL   HCT 43.0  39.0 - 52.0 %   Hemoglobin 14.6  13.0 - 17.0 g/dL   Comment NOTIFIED PHYSICIAN    POCT I-STAT 3, ART BLOOD GAS (G3+)     Status: Abnormal   Collection Time    08/13/13  1:15 PM      Result Value Ref Range   pH, Arterial 7.440  7.350 - 7.450   pCO2 arterial 34.9 (*)  35.0 - 45.0 mmHg   pO2, Arterial 72.0 (*) 80.0 - 100.0 mmHg   Bicarbonate 24.0  20.0 - 24.0 mEq/L   TCO2 25  0 - 100 mmol/L   O2 Saturation 96.0     Patient temperature 35.7 C     Collection site ARTERIAL LINE     Drawn by Operator     Sample type ARTERIAL    CBC     Status: Abnormal   Collection Time    08/13/13  1:19 PM      Result Value Ref Range   WBC 10.8 (*) 4.0 - 10.5 K/uL   RBC 4.48  4.22 - 5.81 MIL/uL   Hemoglobin 13.5  13.0 - 17.0 g/dL   HCT 38.5 (*) 39.0 - 52.0 %   MCV 85.9  78.0 - 100.0 fL   MCH 30.1  26.0 - 34.0 pg   MCHC 35.1  30.0 - 36.0 g/dL   RDW 13.4  11.5 - 15.5 %   Platelets 120 (*) 150 - 400 K/uL  PROTIME-INR     Status: Abnormal   Collection Time    08/13/13  1:19 PM      Result Value Ref Range   Prothrombin Time 16.3 (*) 11.6 - 15.2 seconds   INR 1.34  0.00 - 1.49  APTT     Status: None   Collection Time    08/13/13  1:19 PM      Result Value Ref Range   aPTT 31  24 - 37 seconds  GLUCOSE, CAPILLARY     Status: None   Collection Time    08/13/13  1:31 PM      Result Value Ref Range   Glucose-Capillary 73  70 - 99 mg/dL  PREPARE PLATELET PHERESIS     Status: None   Collection Time    08/13/13  2:15 PM      Result Value Ref Range   Unit Number [id number]     Blood Component Type PLTPHER LR1     Unit division 00     Status of Unit ISSUED     Transfusion Status OK TO TRANSFUSE    GLUCOSE, CAPILLARY     Status: None   Collection Time    08/13/13  2:36 PM      Result Value Ref Range   Glucose-Capillary 78  70 - 99 mg/dL  GLUCOSE, CAPILLARY     Status: None   Collection Time    08/13/13  3:28 PM      Result Value Ref Range   Glucose-Capillary 73  70 - 99 mg/dL  GLUCOSE, CAPILLARY     Status: None   Collection Time    08/13/13  4:24 PM      Result Value Ref Range   Glucose-Capillary 81  70 - 99 mg/dL  GLUCOSE, CAPILLARY     Status: Abnormal   Collection Time    08/13/13  5:34 PM      Result Value Ref Range   Glucose-Capillary 107 (*)  70 - 99 mg/dL  CBC     Status: Abnormal   Collection Time    08/13/13  6:30 PM      Result Value Ref Range   WBC 6.9  4.0 - 10.5 K/uL   RBC 4.07 (*) 4.22 - 5.81 MIL/uL   Hemoglobin 12.1 (*) 13.0 - 17.0 g/dL   HCT 34.9 (*) 39.0 - 52.0 %   MCV 85.7  78.0 - 100.0 fL   MCH 29.7  26.0 - 34.0 pg   MCHC 34.7  30.0 - 36.0 g/dL   RDW 13.6  11.5 - 15.5 %   Platelets 150  150 - 400 K/uL  CREATININE, SERUM     Status: Abnormal   Collection Time    08/13/13  6:30 PM      Result Value Ref Range   Creatinine, Ser 1.06  0.50 - 1.35 mg/dL   GFR calc non Af Amer 84 (*) >90 mL/min   GFR calc Af Amer >90  >90 mL/min  MAGNESIUM     Status: Abnormal   Collection Time    08/13/13  6:30 PM      Result Value Ref Range   Magnesium 2.9 (*) 1.5 - 2.5 mg/dL  GLUCOSE, CAPILLARY     Status: Abnormal   Collection Time    08/13/13  6:31 PM      Result Value Ref Range   Glucose-Capillary 106 (*) 70 - 99 mg/dL  POCT I-STAT, CHEM 8     Status: Abnormal   Collection Time    08/13/13  6:34 PM      Result Value Ref Range   Sodium 143  137 - 147 mEq/L   Potassium 3.4 (*) 3.7 - 5.3 mEq/L   Chloride 108  96 - 112 mEq/L   BUN 9  6 - 23 mg/dL   Creatinine, Ser 0.90  0.50 - 1.35 mg/dL   Glucose, Bld 105 (*) 70 - 99 mg/dL   Calcium, Ion 1.05 (*) 1.12 - 1.23  mmol/L   TCO2 23  0 - 100 mmol/L   Hemoglobin 10.9 (*) 13.0 - 17.0 g/dL   HCT 32.0 (*) 39.0 - 52.0 %  GLUCOSE, CAPILLARY     Status: Abnormal   Collection Time    08/13/13  8:02 PM      Result Value Ref Range   Glucose-Capillary 119 (*) 70 - 99 mg/dL  GLUCOSE, CAPILLARY     Status: Abnormal   Collection Time    08/13/13  9:09 PM      Result Value Ref Range   Glucose-Capillary 138 (*) 70 - 99 mg/dL  GLUCOSE, CAPILLARY     Status: Abnormal   Collection Time    08/13/13  9:55 PM      Result Value Ref Range   Glucose-Capillary 135 (*) 70 - 99 mg/dL  GLUCOSE, CAPILLARY     Status: Abnormal   Collection Time    08/13/13 11:00 PM      Result Value Ref  Range   Glucose-Capillary 128 (*) 70 - 99 mg/dL  GLUCOSE, CAPILLARY     Status: Abnormal   Collection Time    08/13/13 11:59 PM      Result Value Ref Range   Glucose-Capillary 124 (*) 70 - 99 mg/dL  BLOOD GAS, ARTERIAL     Status: Abnormal   Collection Time    08/14/13  3:19 AM      Result Value Ref Range   FIO2 0.40     Delivery systems VENTILATOR     Mode PRESSURE REGULATED VOLUME CONTROL     VT 550     Rate 16     Peep/cpap 10.0     pH, Arterial 7.432  7.350 - 7.450   pCO2 arterial 33.0 (*) 35.0 - 45.0 mmHg   pO2, Arterial 156.0 (*) 80.0 - 100.0 mmHg   Bicarbonate 21.6  20.0 - 24.0 mEq/L   TCO2 22.6  0 - 100 mmol/L   Acid-base deficit 2.0  0.0 - 2.0 mmol/L   O2 Saturation 99.5     Patient temperature 98.6     Collection site A-LINE     Drawn by [id number]     Sample type ARTERIAL DRAW     Allens test (pass/fail) PASS  PASS  CBC     Status: Abnormal   Collection Time    08/14/13  4:30 AM      Result Value Ref Range   WBC 8.4  4.0 - 10.5 K/uL   RBC 4.46  4.22 - 5.81 MIL/uL   Hemoglobin 13.2  13.0 - 17.0 g/dL   HCT 38.1 (*) 39.0 - 52.0 %   MCV 85.4  78.0 - 100.0 fL   MCH 29.6  26.0 - 34.0 pg   MCHC 34.6  30.0 - 36.0 g/dL   RDW 13.9  11.5 - 15.5 %   Platelets 148 (*) 150 - 400 K/uL  MAGNESIUM     Status: None   Collection Time    08/14/13  4:30 AM      Result Value Ref Range   Magnesium 2.1  1.5 - 2.5 mg/dL  COMPREHENSIVE METABOLIC PANEL     Status: Abnormal   Collection Time    08/14/13  4:30 AM      Result Value Ref Range   Sodium 139  137 - 147 mEq/L   Potassium 4.0  3.7 - 5.3 mEq/L   Chloride 105  96 - 112 mEq/L   CO2 23  19 - 32 mEq/L  Glucose, Bld 146 (*) 70 - 99 mg/dL   BUN 9  6 - 23 mg/dL   Creatinine, Ser 1.01  0.50 - 1.35 mg/dL   Calcium 8.2 (*) 8.4 - 10.5 mg/dL   Total Protein 5.4 (*) 6.0 - 8.3 g/dL   Albumin 2.9 (*) 3.5 - 5.2 g/dL   AST 241 (*) 0 - 37 U/L   ALT 209 (*) 0 - 53 U/L   Alkaline Phosphatase 80  39 - 117 U/L   Total Bilirubin 2.3 (*)  0.3 - 1.2 mg/dL   GFR calc non Af Amer 89 (*) >90 mL/min   GFR calc Af Amer >90  >90 mL/min     Assessment/Plan:   NEURO  Arousable and alert.  Able to open eyes spontaneously.  GCS 12T   Plan: Wean secation as we move towards extubation.  PULM  Doing well  No pulmonary issues.   Plan: CPM, move towards extubation.  CARDIO  No issues.  PA catheter in place with good numbers.  No issues   Plan: DC PA catheter per CVTS.    RENAL  Good urine output.   Plan: CPM  GI  Has OGT in place which can be removed when extubated.   Plan: CPM  ID  No known infectious issues.   Plan: Prophylactic antibiotics per CVTS  HEME  Anemia acute blood loss anemia)   Plan: Very mild and does not require transfusion.  ENDO No specific issues   Plan: CPM  Global Issues  Patient should be weaned from the ventilator and extubated.  Possible start clear liquid diet later today.    LOS: 1 day   Additional comments:I reviewed the patient's new clinical lab test results. cbc/bmet and I reviewed the patients new imaging test results. cxr  Critical Care Total Time*: 30 Minutes  Gwenyth Ober 08/14/2013  *Care during the described time interval was provided by me and/or other providers on the critical care team.  I have reviewed this patient's available data, including medical history, events of note, physical examination and test results as part of my evaluation.

## 2013-08-14 NOTE — Procedures (Signed)
Extubation Procedure Note  Patient Details:   Name: Lannis Lichtenwalner DOB: 01-12-1971 MRN: [id number]   Airway Documentation:     Evaluation  O2 sats: stable throughout Complications: No apparent complications Patient did tolerate procedure well. Bilateral Breath Sounds: Rhonchi Suctioning: Oral;Airway Yes Placed on 3l/min [place] IS 659ml Vocalizes well  Jones Skene Aleksandar Duve 08/14/2013, 8:41 AM

## 2013-08-14 NOTE — Consult Note (Signed)
patient examined and medical record reviewed,agree with above note. Bradley Cooper 08/14/2013

## 2013-08-14 NOTE — Progress Notes (Signed)
1 Day Post-Op Procedure(s) (LRB): MEDIAN STERNOTOMY FOR CHEST EXPLORATION; PLACEMENT OF RIGHT FEMORAL ARTERIAL LINE INTRAOPERATIVE TRANSESOPHAGEAL ECHOCARDIOGRAM (N/A) Subjective:  Hemodynamics stable and patient responsive on vent Ready to extubate  Objective: Vital signs in last 24 hours: Temp:  [96.3 F (35.7 C)-100.2 F (37.9 C)] 100 F (37.8 C) (06/03 0800) Pulse Rate:  [56-116] 74 (06/03 0800) Cardiac Rhythm:  [-] Normal sinus rhythm (06/03 0800) Resp:  [13-25] 20 (06/03 0800) BP: (107-149)/(61-104) 122/73 mmHg (06/03 0800) SpO2:  [94 %-100 %] 94 % (06/03 0811) Arterial Line BP: (94-159)/(68-101) 134/71 mmHg (06/03 0800) FiO2 (%):  [40 %-100 %] 40 % (06/03 0811) Weight:  [150 lb (68.04 kg)-176 lb 9.4 oz (80.1 kg)] 176 lb 9.4 oz (80.1 kg) (06/03 0500)  Hemodynamic parameters for last 24 hours: PAP: (19-32)/(9-23) 19/9 mmHg CO:  [3.5 L/min-6.1 L/min] 4.6 L/min CI:  [1.9 L/min/m2-3.4 L/min/m2] 2.6 L/min/m2  Intake/Output from previous day: 06/02 0701 - 06/03 0700 In: 10635.6 [I.V.:6406.6; Blood:3149; NG/GT:30; IV Piggyback:1050] Out: 6770 [Urine:4270; Blood:1800; Chest Tube:700] Intake/Output this shift: Total I/O In: 38.1 [I.V.:38.1] Out: 85 [Urine:75; Chest Tube:10]  Min CT drainage nsr  Lab Results:  Recent Labs  08/13/13 1830 08/13/13 1834 08/14/13 0430  WBC 6.9  --  8.4  HGB 12.1* 10.9* 13.2  HCT 34.9* 32.0* 38.1*  PLT 150  --  148*   BMET:  Recent Labs  08/13/13 1834 08/14/13 0430  NA 143 139  K 3.4* 4.0  CL 108 105  CO2  --  23  GLUCOSE 105* 146*  BUN 9 9  CREATININE 0.90 1.01  CALCIUM  --  8.2*    PT/INR:  Recent Labs  08/13/13 1319  LABPROT 16.3*  INR 1.34   ABG    Component Value Date/Time   PHART 7.432 08/14/2013 0319   HCO3 21.6 08/14/2013 0319   TCO2 22.6 08/14/2013 0319   ACIDBASEDEF 2.0 08/14/2013 0319   O2SAT 99.5 08/14/2013 0319   CBG (last 3)   Recent Labs  08/13/13 2155 08/13/13 2300 08/13/13 2359  GLUCAP 135* 128*  124*    Assessment/Plan: S/P Procedure(s) (LRB): MEDIAN STERNOTOMY FOR CHEST EXPLORATION; PLACEMENT OF RIGHT FEMORAL ARTERIAL LINE INTRAOPERATIVE TRANSESOPHAGEAL ECHOCARDIOGRAM (N/A) See progression orders   LOS: 1 day    Bradley Cooper 08/14/2013

## 2013-08-15 ENCOUNTER — Inpatient Hospital Stay (HOSPITAL_COMMUNITY): Payer: No Typology Code available for payment source

## 2013-08-15 LAB — CBC
HCT: 39.5 % (ref 39.0–52.0)
Hemoglobin: 13.7 g/dL (ref 13.0–17.0)
MCH: 30.7 pg (ref 26.0–34.0)
MCHC: 34.7 g/dL (ref 30.0–36.0)
MCV: 88.6 fL (ref 78.0–100.0)
Platelets: 160 10*3/uL (ref 150–400)
RBC: 4.46 MIL/uL (ref 4.22–5.81)
RDW: 14.3 % (ref 11.5–15.5)
WBC: 13.3 10*3/uL — ABNORMAL HIGH (ref 4.0–10.5)

## 2013-08-15 LAB — GLUCOSE, CAPILLARY
GLUCOSE-CAPILLARY: 100 mg/dL — AB (ref 70–99)
GLUCOSE-CAPILLARY: 104 mg/dL — AB (ref 70–99)
GLUCOSE-CAPILLARY: 121 mg/dL — AB (ref 70–99)
GLUCOSE-CAPILLARY: 137 mg/dL — AB (ref 70–99)
GLUCOSE-CAPILLARY: 161 mg/dL — AB (ref 70–99)
Glucose-Capillary: 109 mg/dL — ABNORMAL HIGH (ref 70–99)
Glucose-Capillary: 114 mg/dL — ABNORMAL HIGH (ref 70–99)
Glucose-Capillary: 122 mg/dL — ABNORMAL HIGH (ref 70–99)
Glucose-Capillary: 122 mg/dL — ABNORMAL HIGH (ref 70–99)
Glucose-Capillary: 122 mg/dL — ABNORMAL HIGH (ref 70–99)
Glucose-Capillary: 123 mg/dL — ABNORMAL HIGH (ref 70–99)
Glucose-Capillary: 126 mg/dL — ABNORMAL HIGH (ref 70–99)
Glucose-Capillary: 128 mg/dL — ABNORMAL HIGH (ref 70–99)
Glucose-Capillary: 134 mg/dL — ABNORMAL HIGH (ref 70–99)

## 2013-08-15 LAB — COMPREHENSIVE METABOLIC PANEL
ALT: 172 U/L — ABNORMAL HIGH (ref 0–53)
AST: 119 U/L — ABNORMAL HIGH (ref 0–37)
Albumin: 3 g/dL — ABNORMAL LOW (ref 3.5–5.2)
Alkaline Phosphatase: 88 U/L (ref 39–117)
BUN: 6 mg/dL (ref 6–23)
CO2: 25 mEq/L (ref 19–32)
Calcium: 8.6 mg/dL (ref 8.4–10.5)
Chloride: 101 mEq/L (ref 96–112)
Creatinine, Ser: 0.86 mg/dL (ref 0.50–1.35)
GFR calc Af Amer: >90 mL/min (ref >90)
GFR calc non Af Amer: >90 mL/min (ref >90)
Glucose, Bld: 119 mg/dL — ABNORMAL HIGH (ref 70–99)
Potassium: 3.9 mEq/L (ref 3.7–5.3)
Sodium: 135 mEq/L — ABNORMAL LOW (ref 137–147)
Total Bilirubin: 1.9 mg/dL — ABNORMAL HIGH (ref 0.3–1.2)
Total Protein: 6.3 g/dL (ref 6.0–8.3)

## 2013-08-15 MED ORDER — FUROSEMIDE 10 MG/ML IJ SOLN
20.0000 mg | Freq: Once | INTRAMUSCULAR | Status: AC
  Administered 2013-08-15: 20 mg via INTRAVENOUS
  Filled 2013-08-15: qty 2

## 2013-08-15 MED ORDER — IOHEXOL 300 MG/ML  SOLN
80.0000 mL | Freq: Once | INTRAMUSCULAR | Status: AC | PRN
  Administered 2013-08-15: 70 mL via INTRAVENOUS
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
## 2013-08-15 NOTE — Progress Notes (Signed)
TCTS BRIEF SICU PROGRESS NOTE  2 Days Post-Op  S/P Procedure(s) (LRB): MEDIAN STERNOTOMY FOR CHEST EXPLORATION; PLACEMENT OF RIGHT FEMORAL ARTERIAL LINE INTRAOPERATIVE TRANSESOPHAGEAL ECHOCARDIOGRAM (N/A)   Stable day  Plan: Continue current plan  Rexene Alberts 08/15/2013 5:53 PM
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
## 2013-08-16 ENCOUNTER — Encounter (HOSPITAL_COMMUNITY): Payer: Self-pay | Admitting: Cardiothoracic Surgery

## 2013-08-16 ENCOUNTER — Inpatient Hospital Stay (HOSPITAL_COMMUNITY): Payer: No Typology Code available for payment source

## 2013-08-16 LAB — TYPE AND SCREEN
ABO/RH(D): B POS
ANTIBODY SCREEN: NEGATIVE
UNIT DIVISION: 0
UNIT DIVISION: 0
UNIT DIVISION: 0
UNIT DIVISION: 0
UNIT DIVISION: 0
UNIT DIVISION: 0
UNIT DIVISION: 0
UNIT DIVISION: 0
UNIT DIVISION: 0
UNIT DIVISION: 0
Unit division: 0
Unit division: 0
Unit division: 0
Unit division: 0
Unit division: 0
Unit division: 0
Unit division: 0
Unit division: 0
Unit division: 0
Unit division: 0

## 2013-08-16 LAB — GLUCOSE, CAPILLARY
GLUCOSE-CAPILLARY: 100 mg/dL — AB (ref 70–99)
Glucose-Capillary: 148 mg/dL — ABNORMAL HIGH (ref 70–99)
Glucose-Capillary: 149 mg/dL — ABNORMAL HIGH (ref 70–99)
Glucose-Capillary: 103 mg/dL — ABNORMAL HIGH (ref 70–99)
Glucose-Capillary: 123 mg/dL — ABNORMAL HIGH (ref 70–99)

## 2013-08-16 LAB — COMPREHENSIVE METABOLIC PANEL
ALT: 119 U/L — ABNORMAL HIGH (ref 0–53)
AST: 53 U/L — ABNORMAL HIGH (ref 0–37)
Albumin: 2.8 g/dL — ABNORMAL LOW (ref 3.5–5.2)
Alkaline Phosphatase: 91 U/L (ref 39–117)
BUN: 6 mg/dL (ref 6–23)
CO2: 28 mEq/L (ref 19–32)
Calcium: 9 mg/dL (ref 8.4–10.5)
Chloride: 97 mEq/L (ref 96–112)
Creatinine, Ser: 0.87 mg/dL (ref 0.50–1.35)
GFR calc Af Amer: >90 mL/min (ref >90)
GFR calc non Af Amer: >90 mL/min (ref >90)
Glucose, Bld: 158 mg/dL — ABNORMAL HIGH (ref 70–99)
Potassium: 3.6 mEq/L — ABNORMAL LOW (ref 3.7–5.3)
Sodium: 137 mEq/L (ref 137–147)
Total Bilirubin: 1 mg/dL (ref 0.3–1.2)
Total Protein: 6.5 g/dL (ref 6.0–8.3)

## 2013-08-16 LAB — CBC
HCT: 42.3 % (ref 39.0–52.0)
Hemoglobin: 13.5 g/dL (ref 13.0–17.0)
MCH: 29.2 pg (ref 26.0–34.0)
MCHC: 31.9 g/dL (ref 30.0–36.0)
MCV: 91.6 fL (ref 78.0–100.0)
Platelets: 144 10*3/uL — ABNORMAL LOW (ref 150–400)
RBC: 4.62 MIL/uL (ref 4.22–5.81)
RDW: 14.2 % (ref 11.5–15.5)
WBC: 10.9 10*3/uL — ABNORMAL HIGH (ref 4.0–10.5)

## 2013-08-16 MED ORDER — KETOROLAC TROMETHAMINE 30 MG/ML IJ SOLN
30.0000 mg | Freq: Once | INTRAMUSCULAR | Status: AC
  Administered 2013-08-16: 30 mg via INTRAVENOUS
  Filled 2013-08-16: qty 1

## 2013-08-16 MED ORDER — LEVALBUTEROL HCL 1.25 MG/0.5ML IN NEBU
1.2500 mg | INHALATION_SOLUTION | Freq: Four times a day (QID) | RESPIRATORY_TRACT | Status: DC | PRN
  Filled 2013-08-16: qty 0.5

## 2013-08-16 MED ORDER — SODIUM CHLORIDE 0.9 % IV SOLN: 250.0000 mL | INTRAVENOUS | Status: DC | PRN

## 2013-08-16 MED ORDER — SODIUM CHLORIDE 0.9 % IJ SOLN
3.0000 mL | Freq: Two times a day (BID) | INTRAMUSCULAR | Status: DC
  Administered 2013-08-17: 3 mL via INTRAVENOUS

## 2013-08-16 MED ORDER — ENSURE COMPLETE PO LIQD
237.0000 mL | Freq: Two times a day (BID) | ORAL | Status: DC
  Administered 2013-08-16 – 2013-08-18 (×3): 237 mL via ORAL

## 2013-08-16 MED ORDER — POTASSIUM CHLORIDE CRYS ER 10 MEQ PO TBCR
10.0000 meq | EXTENDED_RELEASE_TABLET | Freq: Every day | ORAL | Status: DC
  Administered 2013-08-16: 10 meq via ORAL
  Filled 2013-08-16 (×3): qty 1

## 2013-08-16 MED ORDER — INSULIN ASPART 100 UNIT/ML ~~LOC~~ SOLN
0.0000 [IU] | Freq: Three times a day (TID) | SUBCUTANEOUS | Status: DC
  Administered 2013-08-16 – 2013-08-17 (×2): 2 [IU] via SUBCUTANEOUS

## 2013-08-16 MED ORDER — SODIUM CHLORIDE 0.9 % IJ SOLN: 3.0000 mL | INTRAMUSCULAR | Status: DC | PRN

## 2013-08-16 MED ORDER — FUROSEMIDE 10 MG/ML IJ SOLN
20.0000 mg | Freq: Every day | INTRAMUSCULAR | Status: DC
  Administered 2013-08-16: 20 mg via INTRAVENOUS
  Filled 2013-08-16 (×3): qty 2

## 2013-08-16 MED ORDER — POTASSIUM CHLORIDE 10 MEQ/50ML IV SOLN
10.0000 meq | INTRAVENOUS | Status: AC
  Administered 2013-08-16 (×3): 10 meq via INTRAVENOUS

## 2013-08-16 MED ORDER — KETOROLAC TROMETHAMINE 15 MG/ML IJ SOLN
15.0000 mg | Freq: Four times a day (QID) | INTRAMUSCULAR | Status: AC
  Administered 2013-08-16 – 2013-08-17 (×2): 15 mg via INTRAVENOUS
  Filled 2013-08-16 (×7): qty 1

## 2013-08-16 MED ORDER — OXYCODONE HCL 5 MG PO TABS
10.0000 mg | ORAL_TABLET | ORAL | Status: DC | PRN
  Administered 2013-08-16 – 2013-08-17 (×5): 10 mg via ORAL
  Administered 2013-08-17: 15 mg via ORAL
  Administered 2013-08-17 – 2013-08-18 (×3): 10 mg via ORAL
  Filled 2013-08-16: qty 3
  Filled 2013-08-16 (×9): qty 2

## 2013-08-16 MED ORDER — MOVING RIGHT ALONG BOOK
Freq: Once | Status: AC
  Administered 2013-08-16: 08:00:00
  Filled 2013-08-16: qty 1

## 2013-08-16 NOTE — Clinical Social Work Note (Signed)
Clinical Social Worker following patient for support and discharge planning needs.  Patient has orders to transfer to the floor with plans to return home once medically ready.  Clinical Social Worker will sign off for now as social work intervention is no longer needed. Please consult [place] again if new need arises.  Barbette Or, [place]

## 2013-08-16 NOTE — Anesthesia Postprocedure Evaluation (Signed)
  Anesthesia Post-op Note  Patient: Bradley Cooper  Procedure(s) Performed: Procedure(s) with comments: [place]; PLACEMENT OF RIGHT FEMORAL ARTERIAL LINE - Repair of right ventricle INTRAOPERATIVE TRANSESOPHAGEAL ECHOCARDIOGRAM (N/A)  Patient Location: SICU  Anesthesia Type:General  Level of Consciousness: awake, alert , oriented and patient cooperative  Airway and Oxygen Therapy: Patient Spontanous Breathing  Post-op Pain: mild  Post-op Assessment: Post-op Vital signs reviewed, Patient's Cardiovascular Status Stable, Respiratory Function Stable, Patent Airway, No signs of Nausea or vomiting, Adequate PO intake and Pain level controlled  Post-op Vital Signs: Reviewed and stable  Last Vitals:  Filed Vitals:   08/16/13 1000  BP: 123/79  Pulse: 91  Temp:   Resp: 15    Complications: No apparent anesthesia complications
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
## 2013-08-16 NOTE — Progress Notes (Signed)
UR completed.  Trace Wirick, RN BSN M[facility] CCM Trauma/Neuro ICU Case Manager [phone]  
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
## 2013-08-16 NOTE — Progress Notes (Signed)
1445 Cardiac Rehab On arrival pt sitting on side of bed. Explained that I was here to take him for a walk. He states that he has done all the walking that he is going to do. Pt states that he has walked twice. There is only one documented on 2 south this am. He refuses now. I explained that he needs to do three walks per day. Encouraged him to walk again today. We will follow pt tomorrow. Deon Pilling, RN 08/16/2013 2:57 PM

## 2013-08-17 ENCOUNTER — Inpatient Hospital Stay (HOSPITAL_COMMUNITY): Payer: No Typology Code available for payment source

## 2013-08-17 LAB — COMPREHENSIVE METABOLIC PANEL
ALT: 83 U/L — ABNORMAL HIGH (ref 0–53)
AST: 36 U/L (ref 0–37)
Albumin: 2.7 g/dL — ABNORMAL LOW (ref 3.5–5.2)
Alkaline Phosphatase: 108 U/L (ref 39–117)
BUN: 9 mg/dL (ref 6–23)
CO2: 24 mEq/L (ref 19–32)
Calcium: 9.2 mg/dL (ref 8.4–10.5)
Chloride: 99 mEq/L (ref 96–112)
Creatinine, Ser: 0.87 mg/dL (ref 0.50–1.35)
GFR calc Af Amer: >90 mL/min (ref >90)
GFR calc non Af Amer: >90 mL/min (ref >90)
Glucose, Bld: 122 mg/dL — ABNORMAL HIGH (ref 70–99)
Potassium: 4.5 mEq/L (ref 3.7–5.3)
Sodium: 137 mEq/L (ref 137–147)
Total Bilirubin: 1.1 mg/dL (ref 0.3–1.2)
Total Protein: 6.7 g/dL (ref 6.0–8.3)

## 2013-08-17 LAB — CBC
HCT: 41.8 % (ref 39.0–52.0)
Hemoglobin: 14.1 g/dL (ref 13.0–17.0)
MCH: 30.1 pg (ref 26.0–34.0)
MCHC: 33.7 g/dL (ref 30.0–36.0)
MCV: 89.1 fL (ref 78.0–100.0)
Platelets: 178 10*3/uL (ref 150–400)
RBC: 4.69 MIL/uL (ref 4.22–5.81)
RDW: 13.7 % (ref 11.5–15.5)
WBC: 11.8 10*3/uL — ABNORMAL HIGH (ref 4.0–10.5)

## 2013-08-17 LAB — GLUCOSE, CAPILLARY
GLUCOSE-CAPILLARY: 116 mg/dL — AB (ref 70–99)
Glucose-Capillary: 116 mg/dL — ABNORMAL HIGH (ref 70–99)
Glucose-Capillary: 105 mg/dL — ABNORMAL HIGH (ref 70–99)
Glucose-Capillary: 123 mg/dL — ABNORMAL HIGH (ref 70–99)

## 2013-08-17 NOTE — Progress Notes (Signed)
CARDIAC REHAB PHASE I   PRE:  Rate/Rhythm: Sinus 95  BP:    Sitting: 122/79     SaO2: 94% Room air  MODE:  Ambulation: 200 ft   POST:  Rate/Rhythem: Sinus Tach 111   BP:    Sitting: 138/87    SaO2: 94% Room air  Patient walked independently in the hallway. Chest tube to water seal and intact. Patient assisted back to bed with call light within reach. Tolerated well.  Magda Kiel RN BSN
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
## 2013-08-17 NOTE — Progress Notes (Signed)
Patient refused to walk this morning says his stomach is upset after having a bowel movement. Patient given a sprite will check back later.

## 2013-08-17 NOTE — Progress Notes (Signed)
08/17/2013 11:27 AM Nursing note Pt. Refused IV lasix and PO Potassium this morning. Pt. Provided education as to why these medications are prescribed and their importance to his plan of care but patient still declined. Will continue to monitor patient.  [place]
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
## 2013-08-17 NOTE — Progress Notes (Signed)
08/17/2013 1:47 PM Nursing note CT d/c per orders and per protocol. Pt. Tolerated well. Confirmed with Dr. Prescott Gum no need for stat [facility] after CT removal. 2 View chest xray scheduled for in the am. Will continue to closely monitor patient.  [place]

## 2013-08-18 ENCOUNTER — Inpatient Hospital Stay (HOSPITAL_COMMUNITY): Payer: No Typology Code available for payment source

## 2013-08-18 LAB — CBC
HCT: 43.2 % (ref 39.0–52.0)
Hemoglobin: 14.4 g/dL (ref 13.0–17.0)
MCH: 29.4 pg (ref 26.0–34.0)
MCHC: 33.3 g/dL (ref 30.0–36.0)
MCV: 88.2 fL (ref 78.0–100.0)
Platelets: 209 10*3/uL (ref 150–400)
RBC: 4.9 MIL/uL (ref 4.22–5.81)
RDW: 13.4 % (ref 11.5–15.5)
WBC: 13.4 10*3/uL — ABNORMAL HIGH (ref 4.0–10.5)

## 2013-08-18 LAB — COMPREHENSIVE METABOLIC PANEL
ALT: 76 U/L — ABNORMAL HIGH (ref 0–53)
AST: 43 U/L — ABNORMAL HIGH (ref 0–37)
Albumin: 2.8 g/dL — ABNORMAL LOW (ref 3.5–5.2)
Alkaline Phosphatase: 119 U/L — ABNORMAL HIGH (ref 39–117)
BUN: 11 mg/dL (ref 6–23)
CO2: 23 mEq/L (ref 19–32)
Calcium: 9.6 mg/dL (ref 8.4–10.5)
Chloride: 98 mEq/L (ref 96–112)
Creatinine, Ser: 0.92 mg/dL (ref 0.50–1.35)
GFR calc Af Amer: >90 mL/min (ref >90)
GFR calc non Af Amer: >90 mL/min (ref >90)
Glucose, Bld: 106 mg/dL — ABNORMAL HIGH (ref 70–99)
Potassium: 4.6 mEq/L (ref 3.7–5.3)
Sodium: 136 mEq/L — ABNORMAL LOW (ref 137–147)
Total Bilirubin: 1 mg/dL (ref 0.3–1.2)
Total Protein: 7.2 g/dL (ref 6.0–8.3)

## 2013-08-18 LAB — GLUCOSE, CAPILLARY
GLUCOSE-CAPILLARY: 107 mg/dL — AB (ref 70–99)
GLUCOSE-CAPILLARY: 114 mg/dL — AB (ref 70–99)

## 2013-08-18 MED ORDER — BUDESONIDE-FORMOTEROL FUMARATE 160-4.5 MCG/ACT IN AERO: 2.0000 | INHALATION_SPRAY | Freq: Two times a day (BID) | RESPIRATORY_TRACT | Status: DC

## 2013-08-18 MED ORDER — METOPROLOL TARTRATE 12.5 MG HALF TABLET: 12.5000 mg | ORAL_TABLET | Freq: Two times a day (BID) | ORAL | Status: DC

## 2013-08-18 MED ORDER — ACETAMINOPHEN 500 MG PO TABS: 1000.0000 mg | ORAL_TABLET | Freq: Four times a day (QID) | ORAL | Status: DC

## 2013-08-18 MED ORDER — NAPROXEN 500 MG PO TABS: 500.0000 mg | ORAL_TABLET | Freq: Two times a day (BID) | ORAL | Status: AC

## 2013-08-18 MED ORDER — DSS 100 MG PO CAPS: 200.0000 mg | ORAL_CAPSULE | Freq: Every day | ORAL | Status: DC

## 2013-08-18 MED ORDER — OXYCODONE HCL 10 MG PO TABS: 10.0000 mg | ORAL_TABLET | ORAL | Status: DC | PRN

## 2013-08-18 MED ORDER — FUROSEMIDE 20 MG PO TABS: 20.0000 mg | ORAL_TABLET | Freq: Every day | ORAL | Status: DC

## 2013-08-18 MED ORDER — ASPIRIN 325 MG PO TBEC: 325.0000 mg | DELAYED_RELEASE_TABLET | Freq: Every day | ORAL | Status: DC

## 2013-08-18 MED ORDER — POTASSIUM CHLORIDE CRYS ER 10 MEQ PO TBCR: 10.0000 meq | EXTENDED_RELEASE_TABLET | Freq: Every day | ORAL | Status: DC
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
## 2013-08-18 NOTE — Progress Notes (Signed)
EPW d/c at this time per MD order; VSS; bedrest until 1100; pt tolerated well; will cont. To monitor.
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
## 2013-08-18 NOTE — Progress Notes (Signed)
IV and tele monitor d/c at this time; pt to d/c home with wife; pt given d/c instructions and prescriptions; pt verbalized understanding; pt awaiting family to arrive; will cont. To monitor.

## 2013-08-18 NOTE — Discharge Summary (Signed)
Seen, agree with above.

## 2013-08-20 NOTE — Consult Note (Signed)
patient examined and medical record reviewed,agree with above note. Tharon Aquas Trigt 08/20/2013

## 2013-08-21 LAB — DRUG SCREEN PANEL (SERUM)

## 2013-08-23 ENCOUNTER — Other Ambulatory Visit: Payer: Self-pay | Admitting: *Deleted

## 2013-08-23 ENCOUNTER — Encounter (INDEPENDENT_AMBULATORY_CARE_PROVIDER_SITE_OTHER): Payer: Self-pay

## 2013-08-23 DIAGNOSIS — G8918 Other acute postprocedural pain: Secondary | ICD-10-CM

## 2013-08-23 DIAGNOSIS — D381 Neoplasm of uncertain behavior of trachea, bronchus and lung: Secondary | ICD-10-CM

## 2013-08-23 MED ORDER — OXYCODONE HCL 5 MG PO TABS: 5.0000 mg | ORAL_TABLET | ORAL | Status: DC | PRN

## 2013-08-30 ENCOUNTER — Other Ambulatory Visit: Payer: Self-pay | Admitting: Cardiothoracic Surgery

## 2013-08-30 DIAGNOSIS — S21109A Unspecified open wound of unspecified front wall of thorax without penetration into thoracic cavity, initial encounter: Secondary | ICD-10-CM

## 2013-09-02 ENCOUNTER — Other Ambulatory Visit: Payer: Self-pay | Admitting: *Deleted

## 2013-09-02 DIAGNOSIS — G8918 Other acute postprocedural pain: Secondary | ICD-10-CM

## 2013-09-02 MED ORDER — HYDROCODONE-ACETAMINOPHEN 7.5-325 MG PO TABS: 1.0000 | ORAL_TABLET | ORAL | Status: DC | PRN

## 2013-09-04 ENCOUNTER — Ambulatory Visit (INDEPENDENT_AMBULATORY_CARE_PROVIDER_SITE_OTHER): Payer: Self-pay | Admitting: Cardiothoracic Surgery

## 2013-09-04 ENCOUNTER — Ambulatory Visit
Admission: RE | Admit: 2013-09-04 | Discharge: 2013-09-04 | Disposition: A | Discharge status: Home / Self Care | Payer: Self-pay | Source: Ambulatory Visit | Attending: Cardiothoracic Surgery | Admitting: Cardiothoracic Surgery

## 2013-09-04 ENCOUNTER — Encounter: Payer: Self-pay | Admitting: Cardiothoracic Surgery

## 2013-09-04 VITALS — BP 119/77 | HR 86 | Resp 20 | Ht 68.0 in | Wt 158.0 lb

## 2013-09-04 DIAGNOSIS — S21109A Unspecified open wound of unspecified front wall of thorax without penetration into thoracic cavity, initial encounter: Secondary | ICD-10-CM

## 2013-09-04 DIAGNOSIS — Z09 Encounter for follow-up examination after completed treatment for conditions other than malignant neoplasm: Secondary | ICD-10-CM
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
## 2013-09-12 ENCOUNTER — Other Ambulatory Visit: Payer: Self-pay

## 2013-09-12 DIAGNOSIS — G8918 Other acute postprocedural pain: Secondary | ICD-10-CM

## 2013-09-12 MED ORDER — HYDROCODONE-ACETAMINOPHEN 7.5-325 MG PO TABS: 1.0000 | ORAL_TABLET | Freq: Four times a day (QID) | ORAL | Status: DC | PRN

## 2013-09-12 NOTE — Telephone Encounter (Signed)
RX for Hydrocodone 7.5/500 mg refilled, Can pick up at front desk.

## 2013-09-23 ENCOUNTER — Other Ambulatory Visit: Payer: Self-pay | Admitting: Cardiothoracic Surgery

## 2013-09-23 DIAGNOSIS — J9600 Acute respiratory failure, unspecified whether with hypoxia or hypercapnia: Secondary | ICD-10-CM

## 2013-09-25 ENCOUNTER — Ambulatory Visit: Payer: Self-pay | Admitting: Cardiothoracic Surgery

## 2013-10-02 ENCOUNTER — Ambulatory Visit: Payer: Self-pay | Admitting: Cardiothoracic Surgery

## 2013-10-09 ENCOUNTER — Ambulatory Visit: Payer: Self-pay | Admitting: Cardiothoracic Surgery

## 2013-10-23 ENCOUNTER — Encounter: Payer: Self-pay | Admitting: Cardiothoracic Surgery

## 2013-10-23 ENCOUNTER — Ambulatory Visit (INDEPENDENT_AMBULATORY_CARE_PROVIDER_SITE_OTHER): Payer: Self-pay | Admitting: Cardiothoracic Surgery

## 2013-10-23 ENCOUNTER — Ambulatory Visit
Admission: RE | Admit: 2013-10-23 | Discharge: 2013-10-23 | Disposition: A | Discharge status: Home / Self Care | Payer: Self-pay | Source: Ambulatory Visit | Attending: Cardiothoracic Surgery | Admitting: Cardiothoracic Surgery

## 2013-10-23 VITALS — BP 112/78 | HR 73 | Ht 68.0 in | Wt 158.0 lb

## 2013-10-23 DIAGNOSIS — S21109A Unspecified open wound of unspecified front wall of thorax without penetration into thoracic cavity, initial encounter: Secondary | ICD-10-CM

## 2013-10-23 DIAGNOSIS — J9600 Acute respiratory failure, unspecified whether with hypoxia or hypercapnia: Secondary | ICD-10-CM
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
## 2014-09-08 ENCOUNTER — Encounter (HOSPITAL_COMMUNITY): Payer: Self-pay | Admitting: Oncology

## 2014-09-08 ENCOUNTER — Emergency Department (HOSPITAL_COMMUNITY)
Admission: EM | Admit: 2014-09-08 | Discharge: 2014-09-08 | Discharge status: Against Medical Advice | Payer: No Typology Code available for payment source | Attending: Emergency Medicine | Admitting: Emergency Medicine

## 2014-09-08 DIAGNOSIS — Z720 Tobacco use: Secondary | ICD-10-CM | POA: Insufficient documentation

## 2014-09-08 DIAGNOSIS — J029 Acute pharyngitis, unspecified: Secondary | ICD-10-CM | POA: Insufficient documentation

## 2014-09-08 NOTE — ED Notes (Signed)
Bed: WA16 Expected date:  Expected time:  Means of arrival:  Comments: EMS 

## 2014-09-08 NOTE — ED Notes (Signed)
Pt pacing around room; pt repeatedly states that he wants to leave; pt advised that he may leave AMA; pt has been talking to family on phone and comes to the Nursing Desk and advises that he wants to sign out AMA; pt signed out AMA
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
## 2015-08-12 ENCOUNTER — Ambulatory Visit: Payer: Self-pay | Admitting: Cardiothoracic Surgery

## 2016-03-10 ENCOUNTER — Emergency Department (HOSPITAL_BASED_OUTPATIENT_CLINIC_OR_DEPARTMENT_OTHER)
Admission: EM | Admit: 2016-03-10 | Discharge: 2016-03-10 | Disposition: A | Discharge status: Home / Self Care | Payer: No Typology Code available for payment source | Attending: Emergency Medicine | Admitting: Emergency Medicine

## 2016-03-10 ENCOUNTER — Encounter (HOSPITAL_BASED_OUTPATIENT_CLINIC_OR_DEPARTMENT_OTHER): Payer: Self-pay | Admitting: *Deleted

## 2016-03-10 DIAGNOSIS — L84 Corns and callosities: Secondary | ICD-10-CM | POA: Insufficient documentation

## 2016-03-10 DIAGNOSIS — F17210 Nicotine dependence, cigarettes, uncomplicated: Secondary | ICD-10-CM | POA: Insufficient documentation

## 2016-03-10 HISTORY — DX: Accidental discharge from unspecified firearms or gun, initial encounter: W34.00XA

## 2016-03-10 MED ORDER — WHITE PETROLATUM GEL: 1.0000 "application " | Freq: Every day | 0 refills | Status: AC
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
## 2018-01-16 ENCOUNTER — Emergency Department (HOSPITAL_COMMUNITY)
Admission: EM | Admit: 2018-01-16 | Discharge: 2018-01-16 | Disposition: A | Discharge status: Home / Self Care | Payer: No Typology Code available for payment source | Attending: Emergency Medicine | Admitting: Emergency Medicine

## 2018-01-16 ENCOUNTER — Encounter (HOSPITAL_COMMUNITY): Payer: Self-pay | Admitting: Emergency Medicine

## 2018-01-16 DIAGNOSIS — F1010 Alcohol abuse, uncomplicated: Secondary | ICD-10-CM

## 2018-01-16 DIAGNOSIS — F1910 Other psychoactive substance abuse, uncomplicated: Secondary | ICD-10-CM

## 2018-01-16 DIAGNOSIS — F17210 Nicotine dependence, cigarettes, uncomplicated: Secondary | ICD-10-CM | POA: Insufficient documentation
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
## 2020-10-25 ENCOUNTER — Encounter (HOSPITAL_COMMUNITY): Payer: Self-pay

## 2020-10-25 ENCOUNTER — Emergency Department (HOSPITAL_COMMUNITY)
Admission: EM | Admit: 2020-10-25 | Discharge: 2020-10-26 | Disposition: A | Discharge status: Home / Self Care | Payer: No Typology Code available for payment source | Attending: Emergency Medicine | Admitting: Emergency Medicine

## 2020-10-25 ENCOUNTER — Other Ambulatory Visit: Payer: Self-pay

## 2020-10-25 DIAGNOSIS — X58XXXA Exposure to other specified factors, initial encounter: Secondary | ICD-10-CM | POA: Insufficient documentation

## 2020-10-25 DIAGNOSIS — S61401A Unspecified open wound of right hand, initial encounter: Secondary | ICD-10-CM | POA: Insufficient documentation

## 2020-10-25 DIAGNOSIS — F17210 Nicotine dependence, cigarettes, uncomplicated: Secondary | ICD-10-CM | POA: Insufficient documentation

## 2020-10-25 DIAGNOSIS — S61411A Laceration without foreign body of right hand, initial encounter: Secondary | ICD-10-CM

## 2020-10-25 MED ORDER — BACITRACIN ZINC 500 UNIT/GM EX OINT
TOPICAL_OINTMENT | Freq: Once | CUTANEOUS | Status: AC
  Filled 2020-10-25: qty 0.9

## 2020-10-25 MED ORDER — TETANUS-DIPHTH-ACELL PERTUSSIS 5-2.5-18.5 LF-MCG/0.5 IM SUSY
0.5000 mL | PREFILLED_SYRINGE | Freq: Once | INTRAMUSCULAR | Status: DC
  Filled 2020-10-25: qty 0.5

## 2020-10-25 MED ORDER — DOXYCYCLINE HYCLATE 100 MG PO CAPS: 100.0000 mg | ORAL_CAPSULE | Freq: Two times a day (BID) | ORAL | 0 refills | Status: AC

## 2020-10-25 MED ORDER — DOXYCYCLINE HYCLATE 100 MG PO TABS
100.0000 mg | ORAL_TABLET | Freq: Once | ORAL | Status: AC
  Administered 2020-10-26: 100 mg via ORAL
  Filled 2020-10-25: qty 1
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
## 2020-10-26 MED ORDER — BACITRACIN ZINC 500 UNIT/GM EX OINT
TOPICAL_OINTMENT | CUTANEOUS | Status: AC
  Administered 2020-10-26: 1 via TOPICAL
  Filled 2020-10-26: qty 0.9

## 2020-10-26 NOTE — Discharge Instructions (Addendum)
We discussed that it has been too many days to safely suture your hand without a risk for introducing bacteria into the closed wound. Please continue to bandage the wound with antibiotic ointment, and take the antibiotics that we have prescribed as directed. Return for further evaluation if you become unable to flex the fingers of the right hand, you have new fever, increasing pain, redness, or pus from the wound site.

## 2022-05-02 ENCOUNTER — Emergency Department (HOSPITAL_COMMUNITY): Payer: Self-pay

## 2022-05-02 ENCOUNTER — Other Ambulatory Visit: Payer: Self-pay

## 2022-05-02 ENCOUNTER — Emergency Department (HOSPITAL_COMMUNITY)
Admission: EM | Admit: 2022-05-02 | Discharge: 2022-05-03 | Disposition: A | Discharge status: Home / Self Care | Payer: Self-pay | Attending: Emergency Medicine | Admitting: Emergency Medicine

## 2022-05-02 DIAGNOSIS — S01112A Laceration without foreign body of left eyelid and periocular area, initial encounter: Secondary | ICD-10-CM | POA: Insufficient documentation

## 2022-05-02 DIAGNOSIS — Y92524 Gas station as the place of occurrence of the external cause: Secondary | ICD-10-CM | POA: Insufficient documentation
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
## 2022-05-02 NOTE — ED Triage Notes (Signed)
C collar placed in triage

## 2022-05-02 NOTE — ED Notes (Signed)
Pt non-complaint with full assessment in triage d/t facial pain. Pt irritable with assessment.
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
## 2022-05-03 NOTE — ED Notes (Signed)
Tolerated oral hydration and snack.   Ambulatory to restroom.

## 2022-05-03 NOTE — ED Notes (Signed)
Oral hydration and snack provided.

## 2022-05-03 NOTE — ED Notes (Signed)
Pt ambulatory to and from bathroom without assistance. Gait stable.
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
## 2022-08-01 ENCOUNTER — Other Ambulatory Visit: Payer: Self-pay

## 2022-08-01 ENCOUNTER — Encounter (HOSPITAL_COMMUNITY): Payer: Self-pay | Admitting: Emergency Medicine

## 2022-08-01 ENCOUNTER — Emergency Department (HOSPITAL_COMMUNITY): Payer: Self-pay

## 2022-08-01 ENCOUNTER — Emergency Department (HOSPITAL_COMMUNITY)
Admission: EM | Admit: 2022-08-01 | Discharge: 2022-08-01 | Disposition: A | Discharge status: Home / Self Care | Payer: Self-pay | Attending: Emergency Medicine | Admitting: Emergency Medicine

## 2022-08-01 DIAGNOSIS — R4182 Altered mental status, unspecified: Secondary | ICD-10-CM | POA: Insufficient documentation

## 2022-08-01 DIAGNOSIS — R464 Slowness and poor responsiveness: Secondary | ICD-10-CM | POA: Insufficient documentation

## 2022-08-01 DIAGNOSIS — R1110 Vomiting, unspecified: Secondary | ICD-10-CM | POA: Insufficient documentation

## 2022-08-01 DIAGNOSIS — R7309 Other abnormal glucose: Secondary | ICD-10-CM | POA: Insufficient documentation

## 2022-08-01 DIAGNOSIS — T50901A Poisoning by unspecified drugs, medicaments and biological substances, accidental (unintentional), initial encounter: Secondary | ICD-10-CM | POA: Insufficient documentation

## 2022-08-01 LAB — COMPREHENSIVE METABOLIC PANEL
ALT: 17 U/L (ref 0–44)
AST: 30 U/L (ref 15–41)
Albumin: 3.6 g/dL (ref 3.5–5.0)
Alkaline Phosphatase: 75 U/L (ref 38–126)
Anion gap: 6 (ref 5–15)
BUN: 15 mg/dL (ref 6–20)
CO2: 23 mmol/L (ref 22–32)
Calcium: 7.8 mg/dL — ABNORMAL LOW (ref 8.9–10.3)
Chloride: 109 mmol/L (ref 98–111)
Creatinine, Ser: 1.12 mg/dL (ref 0.61–1.24)
GFR, Estimated: >60 mL/min (ref >60)
Glucose, Bld: 102 mg/dL — ABNORMAL HIGH (ref 70–99)
Potassium: 4.7 mmol/L (ref 3.5–5.1)
Sodium: 138 mmol/L (ref 135–145)
Total Bilirubin: 0.8 mg/dL (ref 0.3–1.2)
Total Protein: 7 g/dL (ref 6.5–8.1)

## 2022-08-01 LAB — CBC WITH DIFFERENTIAL/PLATELET
Abs Immature Granulocytes: 0.02 10*3/uL (ref 0.00–0.07)
Basophils Absolute: 0 10*3/uL (ref 0.0–0.1)
Basophils Relative: 1 %
Eosinophils Absolute: 0.1 10*3/uL (ref 0.0–0.5)
Eosinophils Relative: 1 %
HCT: 38.7 % — ABNORMAL LOW (ref 39.0–52.0)
Hemoglobin: 12.1 g/dL — ABNORMAL LOW (ref 13.0–17.0)
Immature Granulocytes: 0 %
Lymphocytes Relative: 19 %
Lymphs Abs: 0.9 10*3/uL (ref 0.7–4.0)
MCH: 29.4 pg (ref 26.0–34.0)
MCHC: 31.3 g/dL (ref 30.0–36.0)
MCV: 93.9 fL (ref 80.0–100.0)
Monocytes Absolute: 0.4 10*3/uL (ref 0.1–1.0)
Monocytes Relative: 8 %
Neutro Abs: 3.4 10*3/uL (ref 1.7–7.7)
Neutrophils Relative %: 71 %
Platelets: 168 10*3/uL (ref 150–400)
RBC: 4.12 MIL/uL — ABNORMAL LOW (ref 4.22–5.81)
RDW: 12.3 % (ref 11.5–15.5)
WBC: 4.9 10*3/uL (ref 4.0–10.5)
nRBC: 0 % (ref 0.0–0.2)

## 2022-08-01 LAB — CBG MONITORING, ED: Glucose-Capillary: 106 mg/dL — ABNORMAL HIGH (ref 70–99)

## 2022-08-01 LAB — ETHANOL: Alcohol, Ethyl (B): <10 mg/dL (ref <10)

## 2022-08-01 LAB — ACETAMINOPHEN LEVEL: Acetaminophen (Tylenol), Serum: <10 ug/mL — ABNORMAL LOW (ref 10–30)

## 2022-08-01 LAB — SALICYLATE LEVEL: Salicylate Lvl: <7 mg/dL — ABNORMAL LOW (ref 7.0–30.0)

## 2022-08-01 NOTE — Discharge Instructions (Addendum)
Please take tylenol/ibuprofen for pain. Avoid drug use. I recommend close follow-up with PCP for reevaluation.  Please do not hesitate to return to emergency department if worrisome signs symptoms we discussed become apparent.
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
## 2022-08-01 NOTE — ED Provider Notes (Signed)
Patient's care resume at shift change from Army Melia, PA-C.  For full HPI, please refer to previous provider notes.  Plan is to continue to monitor and to the patient is ambulatory with steady gait and ready for discharge.  Physical Exam  BP 117/81   Pulse 64   Resp 11   SpO2 99%   Physical Exam Vitals and nursing note reviewed.  Constitutional:      Appearance: Normal appearance.  HENT:     Head: Normocephalic and atraumatic.     Mouth/Throat:     Mouth: Mucous membranes are moist.  Eyes:     General: No scleral icterus. Cardiovascular:     Rate and Rhythm: Normal rate and regular rhythm.     Pulses: Normal pulses.     Heart sounds: Normal heart sounds.  Pulmonary:     Effort: Pulmonary effort is normal.     Breath sounds: Normal breath sounds.  Abdominal:     General: Abdomen is flat.     Palpations: Abdomen is soft.     Tenderness: There is no abdominal tenderness.  Musculoskeletal:        General: No deformity.  Skin:    General: Skin is warm.     Findings: No rash.  Neurological:     General: No focal deficit present.     Mental Status: He is alert.  Psychiatric:        Mood and Affect: Mood normal.     Comments: Patient was able to answer my questions and follow all commands. Ambulating in the hallway with O2Sat 97% on RA.     Procedures  Procedures  ED Course / MDM    Medical Decision Making Amount and/or Complexity of Data Reviewed Labs: ordered. Radiology: ordered.   52 year old male brought in by EMS after he was found unresponsive in the bathroom, had improvement in condition after he was given IV Narcan and brought to the emergency room with nasal cannula O2. Workup including CBC with no evidence of leukocytosis or anemia. CMP with no acute electrolytes abnormality. Pt weaned off of oxygen and O2Sat was 97% on RA. Patient was able to ambulate in the hallway with minimal assistance. He denies headache, lightheadedness, chest pain, shortness of  breath, nausea or vomiting. O2Sat 97% on RA. He was able to answer all my questions and follow commands. Chest DG showed no acute active disease> EKG with no acute ischemic changes. Patient is stable for discharge.  Resource guide for emergency setting abuse and counseling/substance abuse given.  Disposition Continued outpatient therapy. Follow-up with PCP recommended for reevaluation of symptoms. Treatment plan discussed with patient.  Pt acknowledged understanding was agreeable to the plan. Worrisome signs and symptoms were discussed with patient, and patient acknowledged understanding to return to the ED if they noticed these signs and symptoms. Patient was stable upon discharge.   This chart was dictated using voice recognition software.  Despite best efforts to proofread,  errors can occur which can change the documentation meaning.         Jeanelle Malling, PA 08/01/22 1610    Ernie Avena, MD 08/01/22 [phone]

## 2022-08-01 NOTE — ED Provider Notes (Signed)
[name] Provider Note   CSN: [id number] Arrival date & time: 08/01/22  0024     History  Chief Complaint  Patient presents with   Drug Overdose    Bradley Cooper is a 52 y.o. male.  52 year old male brought in by EMS for overdose. Per EMS, patient was found unresponsive in the [employer] bathroom tonight. Patient had vomited, found to have a pipe, no other paraphernalia. Patient was provided with .5mg  IV narcan with improvement in respiratory status and mentation. CBG 119, O2 97%.        Home Medications Prior to Admission medications   Medication Sig Start Date End Date Taking? Authorizing Provider  white petrolatum (VASELINE) GEL Apply 1 application topically daily. 03/10/16   Mesner, Barbara Cower, MD      Allergies    Patient has no known allergies.    Review of Systems   Review of Systems Level 5 caveat for change in mental status  Physical Exam Updated Vital Signs BP 117/81   Pulse 64   Resp 11   SpO2 99%  Physical Exam Vitals and nursing note reviewed.  Constitutional:      General: He is not in acute distress.    Appearance: He is well-developed. He is not diaphoretic.  HENT:     Head: Normocephalic and atraumatic.     Mouth/Throat:     Mouth: Mucous membranes are moist.  Eyes:     Conjunctiva/sclera: Conjunctivae normal.  Cardiovascular:     Rate and Rhythm: Normal rate and regular rhythm.     Pulses: Normal pulses.     Heart sounds: Normal heart sounds.  Pulmonary:     Effort: Pulmonary effort is normal.     Breath sounds: Normal breath sounds.  Abdominal:     Palpations: Abdomen is soft.     Tenderness: There is no abdominal tenderness.  Musculoskeletal:        General: No deformity or signs of injury.     Cervical back: Neck supple.     Right lower leg: No edema.     Left lower leg: No edema.  Skin:    General: Skin is warm and dry.     Findings: No erythema or rash.  Neurological:     GCS:  GCS eye subscore is 2. GCS verbal subscore is 1. GCS motor subscore is 6.     Comments: Patient will not speak but does follow commands and opens eyes to painful stimuli     ED Results / Procedures / Treatments   Labs (all labs ordered are listed, but only abnormal results are displayed) Labs Reviewed  COMPREHENSIVE METABOLIC PANEL - Abnormal; Notable for the following components:      Result Value   Glucose, Bld 102 (*)    Calcium 7.8 (*)    All other components within normal limits  SALICYLATE LEVEL - Abnormal; Notable for the following components:   Salicylate Lvl <7.0 (*)    All other components within normal limits  ACETAMINOPHEN LEVEL - Abnormal; Notable for the following components:   Acetaminophen (Tylenol), Serum <10 (*)    All other components within normal limits  CBC WITH DIFFERENTIAL/PLATELET - Abnormal; Notable for the following components:   RBC 4.12 (*)    Hemoglobin 12.1 (*)    HCT 38.7 (*)    All other components within normal limits  CBG MONITORING, ED - Abnormal; Notable for the following components:   Glucose-Capillary 106 (*)  All other components within normal limits  ETHANOL  RAPID URINE DRUG SCREEN, HOSP PERFORMED    EKG None  Radiology DG Chest Port 1 View  Result Date: 08/01/2022 CLINICAL DATA:  Overdose. EXAM: PORTABLE CHEST 1 VIEW COMPARISON:  Chest radiograph dated 10/23/2013. FINDINGS: Evaluation is limited due to patient's rotation and positioning. Shallow inspiration. No focal consolidation, pleural effusion, or pneumothorax. The cardiac silhouette is within normal limits. Median sternotomy wires. No acute osseous pathology. IMPRESSION: No active disease. Electronically Signed   By: Elgie Collard M.D.   On: 08/01/2022 02:09    Procedures Procedures    Medications Ordered in ED Medications - No data to display  ED Course/ Medical Decision Making/ A&P                             Medical Decision Making Amount and/or Complexity of  Data Reviewed Labs: ordered. Radiology: ordered.   This patient presents to the ED for concern of overdose, this involves an extensive number of treatment options, and is a complaint that carries with it a high risk of complications and morbidity.  The differential diagnosis includes but not limited to overdose, aspiration, hypoxia   Co morbidities that complicate the patient evaluation  Alcohol abuse, polysubstance abuse   Additional history obtained:  Additional history obtained from EMS who contributes to history as above External records from outside source obtained and reviewed including prior drug screen, prior labs on file   Lab Tests:  I Ordered, and personally interpreted labs.  The pertinent results include:  alcohol, APAP, salicylate levels all negative. CMP with Ca 7.8. CBC without significant findings.   Imaging Studies ordered:  I ordered imaging studies including CXR  I independently visualized and interpreted imaging which showed poor positioning, sternotomy wires I agree with the radiologist interpretation- no acute process   Cardiac Monitoring: / EKG:  The patient was maintained on a cardiac monitor.  I personally viewed and interpreted the cardiac monitored which showed an underlying rhythm of: sinus rhythm, rate 66  Problem List / ED Course / Critical interventions / Medication management  52 year old male brought in by EMS after he was found unresponsive in the bathroom, had improvement in condition after he was given IV Narcan and brought to the emergency room with nasal cannula O2 maintaining O2 sat 99%, arouses to verbal stimuli.  Follows simple commands, will not answer questions.  Patient is monitored in the emergency department, improving, arouses to verbal stimuli and will answer simple questions.  Will not report what substance he used tonight. On recheck, patient wakes to verbal stimuli, discussed presentation tonight in the emergency room and the  seriousness of events leading up to his arrival in the ER.  Patient verbalizes understanding, will not state what he overdosed on, does not deny overdosing.  Patient falls asleep through conversation.  Will continue to monitor until he is ambulatory with steady gait and ready for discharge. I have reviewed the patients home medicines and have made adjustments as needed   Social Determinants of Health:  No PCP on file   Test / Admission - Considered:  Disposition pending at time of signout to oncoming provider         Final Clinical Impression(s) / ED Diagnoses Final diagnoses:  Accidental overdose, initial encounter    Rx / DC Orders ED Discharge Orders     None         Jeannie Fend,  PA-C 08/01/22 1610    Tilden Fossa, MD 08/01/22 [phone]
